# Patient Record
Sex: Female | Born: 1990 | Race: Black or African American | Hispanic: No | Marital: Single | Smoking: Never smoker
Health system: Southern US, Community
[De-identification: ages and names within clinical notes are randomized; demographics above are authoritative.]

## PROBLEM LIST (undated history)

## (undated) DIAGNOSIS — E669 Obesity, unspecified: Secondary | ICD-10-CM

## (undated) DIAGNOSIS — D649 Anemia, unspecified: Secondary | ICD-10-CM

## (undated) HISTORY — DX: Anemia, unspecified: D64.9

---

## 2008-03-15 LAB — HM PAP SMEAR: HM Pap smear: NORMAL

## 2008-04-03 ENCOUNTER — Ambulatory Visit: Payer: Self-pay | Admitting: Family Medicine

## 2008-04-25 ENCOUNTER — Inpatient Hospital Stay: Payer: Self-pay | Admitting: Obstetrics & Gynecology

## 2011-07-25 ENCOUNTER — Emergency Department: Payer: Self-pay | Admitting: Unknown Physician Specialty

## 2011-11-09 HISTORY — PX: TUBAL LIGATION: SHX77

## 2012-01-26 ENCOUNTER — Ambulatory Visit: Payer: Self-pay | Admitting: Internal Medicine

## 2012-03-15 ENCOUNTER — Encounter: Payer: Self-pay | Admitting: Internal Medicine

## 2012-03-15 ENCOUNTER — Ambulatory Visit (INDEPENDENT_AMBULATORY_CARE_PROVIDER_SITE_OTHER): Payer: BC Managed Care – PPO | Admitting: Internal Medicine

## 2012-03-15 VITALS — BP 118/72 | HR 71 | Temp 98.7°F | Wt 246.0 lb

## 2012-03-15 DIAGNOSIS — N390 Urinary tract infection, site not specified: Secondary | ICD-10-CM | POA: Insufficient documentation

## 2012-03-15 DIAGNOSIS — Z3480 Encounter for supervision of other normal pregnancy, unspecified trimester: Secondary | ICD-10-CM | POA: Insufficient documentation

## 2012-03-15 DIAGNOSIS — Z331 Pregnant state, incidental: Secondary | ICD-10-CM

## 2012-03-15 LAB — POCT URINE PREGNANCY: Preg Test, Ur: POSITIVE

## 2012-03-15 LAB — POCT URINALYSIS DIPSTICK
Glucose, UA: NEGATIVE
Nitrite, UA: NEGATIVE
Urobilinogen, UA: 2

## 2012-03-15 MED ORDER — PRENATAL VITAMINS PLUS 27-1 MG PO TABS: 1.0000 | ORAL_TABLET | Freq: Every day | ORAL | Status: DC

## 2012-03-15 MED ORDER — NITROFURANTOIN MONOHYD MACRO 100 MG PO CAPS: 100.0000 mg | ORAL_CAPSULE | Freq: Two times a day (BID) | ORAL | Status: AC

## 2012-03-15 NOTE — Progress Notes (Signed)
Subjective:    Patient ID: Cynthia Cannon, female    DOB: 24-Jul-1991, 21 y.o.   MRN: [id number]  HPI 21YO female presents to establish care. Primary concern today is 4 months of amenorrhea, nausea, fatigue. She is concerned she may be pregnant.  Also notes 1-2 weeks of dysuria, urinary urgency.  Denies fever, chills, flank pain.  Has been taking OTC Azo with no improvement.  Outpatient Encounter Prescriptions as of 03/15/2012  Medication Sig Dispense Refill  . nitrofurantoin, macrocrystal-monohydrate, (MACROBID) 100 MG capsule Take 1 capsule (100 mg total) by mouth 2 (two) times daily.  5 capsule  10  . Prenatal Vit-Fe Fumarate-FA (PRENATAL VITAMINS PLUS) 27-1 MG TABS Take 1 tablet by mouth daily.  30 tablet  2    Review of Systems  Constitutional: Positive for fatigue. Negative for fever, chills, appetite change and unexpected weight change.  HENT: Negative for ear pain, congestion, sore throat, trouble swallowing, neck pain, voice change and sinus pressure.   Eyes: Negative for visual disturbance.  Respiratory: Negative for cough, shortness of breath, wheezing and stridor.   Cardiovascular: Negative for chest pain, palpitations and leg swelling.  Gastrointestinal: Positive for nausea. Negative for vomiting, abdominal pain, diarrhea, constipation, blood in stool, abdominal distention and anal bleeding.  Genitourinary: Positive for dysuria, urgency, frequency and menstrual problem. Negative for flank pain.  Musculoskeletal: Negative for myalgias, arthralgias and gait problem.  Skin: Negative for color change and rash.  Neurological: Negative for dizziness and headaches.  Hematological: Negative for adenopathy. Does not bruise/bleed easily.  Psychiatric/Behavioral: Negative for suicidal ideas, sleep disturbance and dysphoric mood. The patient is not nervous/anxious.    BP 118/72  Pulse 71  Temp 98.7 F (37.1 C)  Wt 246 lb (111.585 kg)  SpO2 99%     Objective:   Physical Exam    Constitutional: She is oriented to person, place, and time. She appears well-developed and well-nourished. No distress.  HENT:  Head: Normocephalic and atraumatic.  Right Ear: External ear normal.  Left Ear: External ear normal.  Nose: Nose normal.  Mouth/Throat: Oropharynx is clear and moist. No oropharyngeal exudate.  Eyes: Conjunctivae are normal. Pupils are equal, round, and reactive to light. Right eye exhibits no discharge. Left eye exhibits no discharge. No scleral icterus.  Neck: Normal range of motion. Neck supple. No tracheal deviation present. No thyromegaly present.  Cardiovascular: Normal rate, regular rhythm, normal heart sounds and intact distal pulses.  Exam reveals no gallop and no friction rub.   No murmur heard. Pulmonary/Chest: Effort normal and breath sounds normal. No respiratory distress. She has no wheezes. She has no rales. She exhibits no tenderness.  Abdominal: Soft. Bowel sounds are normal. She exhibits distension (palpable uterine fundus). She exhibits no mass. There is no tenderness. There is no guarding.  Musculoskeletal: Normal range of motion. She exhibits no edema and no tenderness.  Lymphadenopathy:    She has no cervical adenopathy.  Neurological: She is alert and oriented to person, place, and time. No cranial nerve deficit. She exhibits normal muscle tone. Coordination normal.  Skin: Skin is warm and dry. No rash noted. She is not diaphoretic. No erythema. No pallor.  Psychiatric: She has a normal mood and affect. Her behavior is normal. Judgment and thought content normal.          Assessment & Plan:

## 2012-03-15 NOTE — Assessment & Plan Note (Signed)
Urine pregnancy test is positive. Pt has not had menses in 4 months.  Will set up referral to OB/GYN.  Will start prenatal vitamin.

## 2012-03-15 NOTE — Assessment & Plan Note (Signed)
Will treat with macrobid. Will send urine for culture. Pt will follow up if symptoms not improving.

## 2012-03-18 LAB — URINE CULTURE

## 2012-03-21 ENCOUNTER — Telehealth: Payer: Self-pay | Admitting: *Deleted

## 2012-03-21 MED ORDER — AMOXICILLIN-POT CLAVULANATE 500-125 MG PO TABS: 1.0000 | ORAL_TABLET | Freq: Two times a day (BID) | ORAL | Status: AC

## 2012-03-21 NOTE — Telephone Encounter (Signed)
Patient informed. New ABX Rx sent to pharmacy/[facility]

## 2012-03-21 NOTE — Telephone Encounter (Signed)
Message copied by Regis Bill on Tue Mar 21, 2012  2:32 PM ------      Message from: Ronna Polio A      Created: Sun Mar 19, 2012  9:30 PM       Urinary tract infection was resistant to macrobid. We need to change to Augmentin 500mg  po bid x7 days.

## 2012-08-01 ENCOUNTER — Observation Stay: Payer: Self-pay | Admitting: Obstetrics & Gynecology

## 2012-08-08 ENCOUNTER — Inpatient Hospital Stay: Payer: Self-pay

## 2012-08-08 LAB — CBC WITH DIFFERENTIAL/PLATELET
Basophil #: 0.1 10*3/uL (ref 0.0–0.1)
Basophil %: 0.8 %
Eosinophil #: 0.1 10*3/uL (ref 0.0–0.7)
HCT: 34.5 % — ABNORMAL LOW (ref 35.0–47.0)
HGB: 11.9 g/dL — ABNORMAL LOW (ref 12.0–16.0)
Lymphocyte %: 22.7 %
MCH: 28.2 pg (ref 26.0–34.0)
MCHC: 34.3 g/dL (ref 32.0–36.0)
Monocyte #: 0.5 x10 3/mm (ref 0.2–0.9)
Neutrophil #: 4.9 10*3/uL (ref 1.4–6.5)
RDW: 14.5 % (ref 11.5–14.5)

## 2012-08-09 LAB — HEMATOCRIT: HCT: 28.9 % — ABNORMAL LOW (ref 35.0–47.0)

## 2012-08-10 LAB — PATHOLOGY REPORT

## 2013-05-02 ENCOUNTER — Emergency Department: Payer: Self-pay | Admitting: Emergency Medicine

## 2015-02-25 NOTE — Op Note (Signed)
PATIENT NAME:  Cynthia Cannon, Cynthia Cannon MR#:  [id number] DATE OF BIRTH:  01-18-1991  DATE OF PROCEDURE:  08/08/2012  PREOPERATIVE DIAGNOSES:  89. 24 year old G2, P1-0-0-1 at 36 weeks, six weeks? gestation.  2. History of prior cesarean section.  3. Spontaneous rupture of membranes.  4. Desires permanent surgical sterilization.   POSTOPERATIVE DIAGNOSES:  78. 24 year old G2, P1-0-0-1 at 36 weeks, six weeks? gestation.  2. History of prior cesarean section.  3. Spontaneous rupture of membranes.  4. Desires permanent surgical sterilization.   PROCEDURE: 1. Repeat low transverse cesarean section.  2. Bilateral tubal ligation via Pomeroy method.   ANESTHESIA:  Spinal.   PRIMARY SURGEON: Florina Ou. Bonney Aid, MD  ASSISTANT:  Farrel Conners, CNM   ESTIMATED BLOOD LOSS: 700 mL.   OPERATIVE FLUIDS: 1500 mL of crystalloid.   DRAINS OR TUBES: Foley to gravity drainage, On-Q catheter system.   PREOPERATIVE ANTIBIOTICS: Two grams of Ancef.   IMPLANTS: None.   SPECIMENS: Portions of right and left tubes.   COMPLICATIONS: None.   INTRAOPERATIVE FINDINGS: Normal tubes, ovaries, and uterus. Minimal adhesive disease of the rectus muscles, no intra-abdominal adhesive disease. Liveborn female infant weighing 2640 grams, 5 pounds 13 ounces, Apgars 9 and 9.   PATIENT CONDITION FOLLOWING PROCEDURE: Stable.   PROCEDURE IN DETAIL: Risks, benefits, and alternatives of the procedure were discussed with the patient prior to proceeding to the operating room. The patient was taken to the operating room where spinal anesthesia was administered. She was then positioned in the supine position, prepped and draped in the usual sterile fashion. A Pfannenstiel skin incision was made utilizing the patient's pre-existing scar. This was carried down sharply to the level of the rectus fascia using a knife. The fascia was incised in the midline using a knife and then extended using Mayo scissors. The superior border of  the rectus fascia was grasped with two Kocher clamps. The underlying rectus muscle was dissected off the fascia using blunt dissection and the median raphe incised using Mayo scissors. The inferior border of the rectus fascia was then dissected off the rectus muscle in a similar fashion. The midline was identified and the peritoneum grasped with a hemostat and then incised using Metzenbaum scissors. The peritoneal incision was extended using manual traction. A bladder blade was placed and a bladder flap was developed using Metzenbaum scissors and developed digitally. The bladder blade was replaced. Hysterotomy incision was made using the knife and the uterus was entered bluntly using the operator's finger. There was still amniotic membrane covering the vertex. This was opened using an Allis clamp. Upon placing the operator's hand into the hysterotomy, the infant was noted to be in the OP position. The vertex was grasped, flexed, brought to the incision and delivered atraumatically using fundal pressure. The infant was suctioned, the cord was clamped and cut, and the infant passed to the awaiting pediatricians. Cord blood was obtained. The placenta was delivered using manual extraction. The uterus was exteriorized and wiped clean of clots and debris with two moist laps. The hysterotomy incision was then closed using a two-layer closure of 0 Vicryl with the first being a running locked and the second being an imbricating layer using vertical imbricating stitch.   Following closure of the hysterotomy incision, attention was turned to the patient's right tube. This was ligated using a Pomeroy method using a 2-0 chromic wheel with two ties on the tube. After excising the knuckle of tube both tubal ostia were visualized and noted to be hemostatic.  This was then repeated on the patient's left tube. The abdomen and pelvis were irrigated using warm saline. The uterus was replaced into the abdomen. The hysterotomy incision  and fallopian tubes were inspected and noted to be hemostatic. The peritoneum was then closed using a 2-0 Vicryl in a running fashion.   The superior border of the rectus fascia was grasped with a Kocher clamp and the On-Q catheter system was placed. The On-Q trocars were placed 4 cm above the superior border of the Pfannenstiel skin incision in the midline. After placing the trocars, they were removed and the On-Q catheters were threaded through the introducers prior to removing the introducers. Following placement of the On-Q catheters, the fascia was closed using a #1 looped PDS in a running fashion. After fascial closure, the subcutaneous dead space was reapproximated using a 2-0 plain gut. During closure of the subcutaneous tissue one of the On-Q catheters was noted to be exposed. This On-Q catheter was therefore, removed. After reapproximating the subcutaneous tissue, the incision was closed using staples. The On-Q catheter was dressed with Dermabond and bolused with 5 mL of 0.5% bupivacaine. The patient tolerated the procedure well. Sponge, needle, and instrument counts were correct times two.    ____________________________ Florina Ou. Bonney Aid, MD ams:bjt D: 08/08/2012 09:39:19 ET T: 08/08/2012 10:17:05 ET JOB#: [id number]  cc: Florina Ou. Bonney Aid, MD, <Dictator> Lorrene Reid MD ELECTRONICALLY SIGNED 08/17/2012 21:03

## 2015-03-06 ENCOUNTER — Emergency Department
Admit: 2015-03-06 | Disposition: A | Discharge status: Home / Self Care | Payer: Self-pay | Admitting: Emergency Medicine

## 2015-03-06 LAB — ED INFLUENZA
INFLAPCR: NEGATIVE
Influenza B By PCR: NEGATIVE

## 2015-03-18 NOTE — H&P (Signed)
L&D Evaluation:  History:   HPI 24 year old G2 P1001 with EDC=08/30/2012 by a 21 wk 6 day ultrasound presents at 4236 6/7 weeks with c/o SROM of clear fluid and onset contractions at 0250 this Am. She has a hx of a previous C-section for FITL and was scheduled for a repeat CS and BTL on 08/23/12. [facility] and is remarkable for late entry to care, obesity (BMI>45), +Chlamydia on 6/24 with a neg TOC on 9/27, and a negative GBS. O POS, RI, VI    Presents with contractions, leaking fluid    Patient's Medical History Obesity    Patient's Surgical History Previous C-Section    Medications Pre Natal Vitamins    Allergies NKDA    Social History none    Family History Non-Contributory   ROS:   ROS All systems were reviewed.  HEENT, CNS, GI, GU, Respiratory, CV, Renal and Musculoskeletal systems were found to be normal., see HPI   Exam:   Vital Signs stable    Urine Protein not completed    General no apparent distress    Mental Status clear    Chest clear    Heart normal sinus rhythm, no murmur/gallop/rubs    Abdomen gravid, tender with contractions    Estimated Fetal Weight Average for gestational age    Fetal Position vtx    Edema no edema    Pelvic no external lesions, 2/80%-2    Mebranes Ruptured    Description clear, grossly ruptured    FHT normal rate with no decels, 130s with accels to 150s    Fetal Heart Rate 140    Ucx q2-4    Skin dry   Impression:   Impression IUP at 36 6/7 weeks with SROM and onset labor. Previous C-section   Plan:   Plan EFM/NST, monitor contractions and for cervical change, Prepare for repeat C-section and BTL. Dr Bonney AidStaebler notified.   Electronic Signatures: Trinna BalloonGutierrez, Yarelly Kuba L (CNM)  (Signed 01-Oct-13 05:18)  Authored: L&D Evaluation   Last Updated: 01-Oct-13 05:18 by Trinna BalloonGutierrez, Nevaeh Korte L (CNM)

## 2015-03-18 NOTE — H&P (Signed)
L&D Evaluation:  History:   HPI 24yo G2P1001 at 36wks presents with c/o contractions x 2 days, worsening in frequency and intensity.  Feels they are every 5 mins.  No VB, LOF.  Good FM.  History of C/S x 1, desires repeat.  [facility]    Presents with contractions    Patient's Medical History No Chronic Illness    Patient's Surgical History Previous C-Section    Medications Pre Natal Vitamins    Allergies NKDA    Social History none    Family History Non-Contributory   Exam:   Vital Signs stable    General no apparent distress, comfortable despite contractions    Mental Status clear    Chest nl effort    Abdomen gravid, non-tender    Estimated Fetal Weight Average for gestational age    Edema no edema    Pelvic 1 / 50 / -4    FHT normal rate with no decels, 140 moderate variability, + accels, no decels    Ucx irregular every 2-365mins   Impression:   Impression R/o PTL with h/o C/S x 1   Plan:   Plan monitor contractions and for cervical change    Comments Cervix unchanged over 2 hours.  Pt remains comfortable.  Labor precautions reviewed.  RLTCS scheduled for 10/19.  [facility]    Follow Up Appointment already scheduled   Electronic Signatures: Garnette GunnerStansbury Clipp, Ali LoweEryn K (MD)  (Signed 24-Sep-13 21:58)  Authored: L&D Evaluation   Last Updated: 24-Sep-13 21:58 by Garnette GunnerStansbury Clipp, Ali LoweEryn K (MD)

## 2017-10-20 ENCOUNTER — Emergency Department: Payer: Medicaid Other

## 2017-10-20 ENCOUNTER — Encounter: Payer: Self-pay | Admitting: Emergency Medicine

## 2017-10-20 ENCOUNTER — Emergency Department
Admission: EM | Admit: 2017-10-20 | Discharge: 2017-10-20 | Disposition: A | Discharge status: Home / Self Care | Payer: Medicaid Other | Attending: Emergency Medicine | Admitting: Emergency Medicine

## 2017-10-20 DIAGNOSIS — M722 Plantar fascial fibromatosis: Secondary | ICD-10-CM | POA: Diagnosis not present

## 2017-10-20 DIAGNOSIS — Z79899 Other long term (current) drug therapy: Secondary | ICD-10-CM | POA: Diagnosis not present

## 2017-10-20 DIAGNOSIS — M79671 Pain in right foot: Secondary | ICD-10-CM | POA: Diagnosis present

## 2017-10-20 MED ORDER — TRAMADOL HCL 50 MG PO TABS: 50.0000 mg | ORAL_TABLET | Freq: Four times a day (QID) | ORAL | 0 refills | Status: DC | PRN

## 2017-10-20 MED ORDER — MELOXICAM 15 MG PO TABS: 15.0000 mg | ORAL_TABLET | Freq: Every day | ORAL | 0 refills | Status: DC

## 2017-10-20 NOTE — Discharge Instructions (Signed)
Follow up with your primary care provider or podiatry for symptoms that are not improving over the week. Return to the ER for symptoms that change or worsen if unable to schedule an appointment.

## 2017-10-20 NOTE — ED Provider Notes (Signed)
[facility] Emergency Department Provider Note ____________________________________________  Time seen: Approximately 7:42 PM  I have reviewed the triage vital signs and the nursing notes.   HISTORY  Chief Complaint Foot Pain    HPI Cynthia Cannon is a 26 y.o. female who presents to the emergency department for treatment and evaluation of right foot pain.  She states that pain started approximately 4 days ago.  She denies any known injury.  Pain increases with weightbearing.  She has taken 1 ibuprofen without any relief. Past Medical History:  Diagnosis Date  . Anemia     Patient Active Problem List   Diagnosis Date Noted  . UTI (urinary tract infection) 03/15/2012  . Prenatal care, subsequent pregnancy 03/15/2012    Past Surgical History:  Procedure Laterality Date  . CESAREAN SECTION     1    Prior to Admission medications   Medication Sig Start Date End Date Taking? Authorizing Provider  meloxicam (MOBIC) 15 MG tablet Take 1 tablet (15 mg total) by mouth daily. 10/20/17   Jameal Razzano, Kasandra Knudsenari B, FNP  Prenatal Vit-Fe Fumarate-FA (PRENATAL VITAMINS PLUS) 27-1 MG TABS Take 1 tablet by mouth daily. 03/15/12   Shelia MediaWalker, Jennifer A, MD  traMADol (ULTRAM) 50 MG tablet Take 1 tablet (50 mg total) by mouth every 6 (six) hours as needed. 10/20/17   Chinita Pesterriplett, Ponciano Shealy B, FNP    Allergies Patient has no known allergies.  Family History  Problem Relation Age of Onset  . Diabetes Mother   . Lupus Father   . Diabetes Sister     Social History Social History   Tobacco Use  . Smoking status: Never Smoker  . Smokeless tobacco: Never Used  Substance Use Topics  . Alcohol use: Yes    Comment: occasionally  . Drug use: No    Review of Systems Constitutional: Negative for recent illness or injury. Cardiovascular: Negative for chest pain Respiratory: Negative for shortness of breath Musculoskeletal: Positive for right foot pain.  Negative for right calf  pain. Skin: Negative for skin lesions, wounds, or changes. Neurological: Negative for paresthesias.  ____________________________________________   PHYSICAL EXAM:  VITAL SIGNS: ED Triage Vitals  Enc Vitals Group     BP 10/20/17 1840 (!) 162/84     Pulse Rate 10/20/17 1840 85     Resp 10/20/17 1840 16     Temp 10/20/17 1840 98.3 F (36.8 C)     Temp Source 10/20/17 1840 Oral     SpO2 10/20/17 1840 98 %     Weight 10/20/17 1841 265 lb (120.2 kg)     Height 10/20/17 1841 5\' 2"  (1.575 m)     Head Circumference --      Peak Flow --      Pain Score 10/20/17 1840 8     Pain Loc --      Pain Edu? --      Excl. in GC? --     Constitutional: Alert and oriented. Well appearing and in no acute distress. Eyes: Conjunctivae are clear without discharge or drainage Head: Atraumatic Neck: Supple. Respiratory: Respirations even and unlabored. Musculoskeletal: Tenderness over the plantar aspect of the right foot from the first metatarsal to the heel.  Full, active range of motion of the foot, toes, and ankle is demonstrated. Neurologic: Sharp and dull sensation intact over the right foot and ankle. Skin: Intact without rash, lesion, or wound. Psychiatric: Affect and behavior are appropriate.  ____________________________________________   LABS (all labs ordered are listed,  but only abnormal results are displayed)  Labs Reviewed - No data to display ____________________________________________  RADIOLOGY  Mild forefoot soft tissue swelling with small calcaneal spurs identified on image of the right foot. ____________________________________________   PROCEDURES  Procedures  ____________________________________________   INITIAL IMPRESSION / ASSESSMENT AND PLAN / ED COURSE  Cynthia Cannon is a 26 y.o. female who presents to the emergency department for evaluation and treatment of nontraumatic right foot pain.  She was prescribed meloxicam and advised to rest, ice, and  elevate the foot as often as possible over the next few days.  She was instructed to follow-up with primary care or podiatry for symptoms that are not improving with medication and rest.  She was instructed to return to the emergency department for symptoms of change or worsen if she is unable to schedule an appointment.  Medications - No data to display  Pertinent labs & imaging results that were available during my care of the patient were reviewed by me and considered in my medical decision making (see chart for details).  _________________________________________   FINAL CLINICAL IMPRESSION(S) / ED DIAGNOSES  Final diagnoses:  Plantar fasciitis of right foot    ED Discharge Orders        Ordered    meloxicam (MOBIC) 15 MG tablet  Daily     10/20/17 1945    traMADol (ULTRAM) 50 MG tablet  Every 6 hours PRN     10/20/17 1945       If controlled substance prescribed during this visit, 12 month history viewed on the [facility] prior to issuing an initial prescription for Schedule II or III opiod.    Chinita Pesterriplett, Jontavious Commons B, FNP 10/21/17 [id number]    Phineas SemenGoodman, Gray[place], MD 10/21/17 [phone]

## 2017-10-20 NOTE — ED Triage Notes (Addendum)
PT to ED via POV with c/o RT foot pain x4days, denies any known injury. Pt ambulatory but states pain with weight bearing. No deformity noted, + swelling to ankle

## 2018-07-02 ENCOUNTER — Encounter: Payer: Self-pay | Admitting: Emergency Medicine

## 2018-07-02 DIAGNOSIS — R51 Headache: Secondary | ICD-10-CM | POA: Diagnosis present

## 2018-07-02 DIAGNOSIS — Z79899 Other long term (current) drug therapy: Secondary | ICD-10-CM | POA: Insufficient documentation

## 2018-07-02 LAB — URINALYSIS, COMPLETE (UACMP) WITH MICROSCOPIC
Bacteria, UA: NONE SEEN
RBC / HPF: >50 RBC/hpf — ABNORMAL HIGH (ref 0–5)
Specific Gravity, Urine: 1.029 (ref 1.005–1.030)
WBC, UA: >50 WBC/hpf — ABNORMAL HIGH (ref 0–5)

## 2018-07-02 LAB — BASIC METABOLIC PANEL
Anion gap: 5 (ref 5–15)
BUN: 13 mg/dL (ref 6–20)
CO2: 27 mmol/L (ref 22–32)
Calcium: 8.8 mg/dL — ABNORMAL LOW (ref 8.9–10.3)
Chloride: 105 mmol/L (ref 98–111)
Creatinine, Ser: 0.86 mg/dL (ref 0.44–1.00)
GFR calc Af Amer: >60 mL/min (ref >60)
GFR calc non Af Amer: >60 mL/min (ref >60)
Glucose, Bld: 103 mg/dL — ABNORMAL HIGH (ref 70–99)
Potassium: 3.6 mmol/L (ref 3.5–5.1)
Sodium: 137 mmol/L (ref 135–145)

## 2018-07-02 LAB — CBC
HCT: 36.9 % (ref 35.0–47.0)
Hemoglobin: 12.9 g/dL (ref 12.0–16.0)
MCH: 28.5 pg (ref 26.0–34.0)
MCHC: 34.9 g/dL (ref 32.0–36.0)
MCV: 81.7 fL (ref 80.0–100.0)
Platelets: 314 10*3/uL (ref 150–440)
RBC: 4.51 MIL/uL (ref 3.80–5.20)
RDW: 15.6 % — ABNORMAL HIGH (ref 11.5–14.5)
WBC: 7.6 10*3/uL (ref 3.6–11.0)

## 2018-07-02 NOTE — ED Triage Notes (Addendum)
Patient with complaint of sudden onset of right side headache with dizziness that started about 10 minutes ago. Patient states that it is the worst headache of her life. Neuro intact.

## 2018-07-03 ENCOUNTER — Encounter: Payer: Self-pay | Admitting: Emergency Medicine

## 2018-07-03 ENCOUNTER — Emergency Department
Admission: EM | Admit: 2018-07-03 | Discharge: 2018-07-03 | Disposition: A | Discharge status: Home / Self Care | Payer: Medicaid Other | Attending: Emergency Medicine | Admitting: Emergency Medicine

## 2018-07-03 ENCOUNTER — Emergency Department: Payer: Medicaid Other

## 2018-07-03 DIAGNOSIS — R519 Headache, unspecified: Secondary | ICD-10-CM

## 2018-07-03 DIAGNOSIS — R51 Headache: Secondary | ICD-10-CM

## 2018-07-03 HISTORY — DX: Obesity, unspecified: E66.9

## 2018-07-03 MED ORDER — SODIUM CHLORIDE 0.9 % IV BOLUS
1000.0000 mL | Freq: Once | INTRAVENOUS | Status: AC
  Administered 2018-07-03: 1000 mL via INTRAVENOUS

## 2018-07-03 MED ORDER — KETOROLAC TROMETHAMINE 30 MG/ML IJ SOLN
15.0000 mg | Freq: Once | INTRAMUSCULAR | Status: AC
  Administered 2018-07-03: 15 mg via INTRAVENOUS
  Filled 2018-07-03: qty 1

## 2018-07-03 MED ORDER — SODIUM CHLORIDE 0.9 % IV BOLUS: 500.0000 mL | Freq: Once | INTRAVENOUS | Status: DC

## 2018-07-03 MED ORDER — ACETAMINOPHEN 500 MG PO TABS
1000.0000 mg | ORAL_TABLET | Freq: Once | ORAL | Status: AC
  Administered 2018-07-03: 1000 mg via ORAL
  Filled 2018-07-03: qty 2

## 2018-07-03 MED ORDER — DEXAMETHASONE SODIUM PHOSPHATE 10 MG/ML IJ SOLN
10.0000 mg | Freq: Once | INTRAMUSCULAR | Status: AC
  Administered 2018-07-03: 10 mg via INTRAVENOUS
  Filled 2018-07-03: qty 1

## 2018-07-03 MED ORDER — DIPHENHYDRAMINE HCL 50 MG/ML IJ SOLN
25.0000 mg | INTRAMUSCULAR | Status: AC
  Administered 2018-07-03: 25 mg via INTRAVENOUS
  Filled 2018-07-03: qty 1

## 2018-07-03 MED ORDER — METOCLOPRAMIDE HCL 5 MG/ML IJ SOLN
10.0000 mg | INTRAMUSCULAR | Status: AC
  Administered 2018-07-03: 10 mg via INTRAVENOUS
  Filled 2018-07-03: qty 2

## 2018-07-03 NOTE — ED Notes (Signed)
Pt to CT via wheelchair

## 2018-07-03 NOTE — ED Provider Notes (Signed)
[facility] Emergency Department Provider Note  ____________________________________________   First MD Initiated Contact with Patient 07/03/18 0129     (approximate)  I have reviewed the triage vital signs and the nursing notes.   HISTORY  Chief Complaint Headache    HPI Cynthia Cannon is a 27 y.o. female who presents for evaluation of headache.  She says that it was acute in onset and severe and is stayed constant since it started about 4-1/2 hours ago.  She says it is primarily on the left side of her head and is a sharp and throbbing pain.  No visual changes.  Some minor photophobia.  "Maybe" some nausea, but nothing significant, and no vomiting.  She has had no numbness or weakness in any of her extremities, no difficulty with ambulation, no difficulty with speech.  She does not have a history of migraines nor does anyone her family.  She sustained no traumas.  Nothing particular makes her symptoms better or worse.  She denies recent fever/chills, chest pain, shortness of breath, nausea, vomiting, abdominal pain, and dysuria.  She has had a tubal ligation and says that she could not be pregnant.  Past Medical History:  Diagnosis Date  . Anemia   . Obesity     Patient Active Problem List   Diagnosis Date Noted  . UTI (urinary tract infection) 03/15/2012  . Prenatal care, subsequent pregnancy 03/15/2012    Past Surgical History:  Procedure Laterality Date  . CESAREAN SECTION     1    Prior to Admission medications   Medication Sig Start Date End Date Taking? Authorizing Provider  meloxicam (MOBIC) 15 MG tablet Take 1 tablet (15 mg total) by mouth daily. 10/20/17   Triplett, Kasandra Knudsen, FNP  Prenatal Vit-Fe Fumarate-FA (PRENATAL VITAMINS PLUS) 27-1 MG TABS Take 1 tablet by mouth daily. 03/15/12   Shelia Media, MD  traMADol (ULTRAM) 50 MG tablet Take 1 tablet (50 mg total) by mouth every 6 (six) hours as needed. 10/20/17   Chinita Pester,  FNP    Allergies Patient has no known allergies.  Family History  Problem Relation Age of Onset  . Diabetes Mother   . Lupus Father   . Diabetes Sister     Social History Social History   Tobacco Use  . Smoking status: Never Smoker  . Smokeless tobacco: Never Used  Substance Use Topics  . Alcohol use: Yes    Comment: occasionally  . Drug use: No    Review of Systems Constitutional: No fever/chills Eyes: No visual changes.  Some mild photophobia ENT: No sore throat. Cardiovascular: Denies chest pain. Respiratory: Denies shortness of breath. Gastrointestinal: No abdominal pain.  Possibly some mild nausea, no vomiting.  No diarrhea.  No constipation. Genitourinary: Negative for dysuria. Musculoskeletal: Negative for neck pain.  Negative for back pain. Integumentary: Negative for rash. Neurological: Headache as described above.  No focal neurological deficits.   ____________________________________________   PHYSICAL EXAM:  VITAL SIGNS: ED Triage Vitals [07/02/18 2251]  Enc Vitals Group     BP (!) 127/93     Pulse Rate 84     Resp 18     Temp 97.8 F (36.6 C)     Temp Source Oral     SpO2 100 %     Weight 108 kg (238 lb)     Height 1.575 m (5\' 2" )     Head Circumference      Peak Flow  Pain Score 10     Pain Loc      Pain Edu?      Excl. in GC?     Constitutional: Alert and oriented. Well appearing and in no acute distress.  Laughing and joking with me. Eyes: Conjunctivae are normal. PERRL. EOMI. Head: Atraumatic. Nose: No congestion/rhinnorhea. Mouth/Throat: Mucous membranes are moist. Neck: No stridor.  No meningeal signs.   Cardiovascular: Normal rate, regular rhythm. Good peripheral circulation. Grossly normal heart sounds. Respiratory: Normal respiratory effort.  No retractions. Lungs CTAB. Gastrointestinal: Soft and nontender. No distention.  Musculoskeletal: No lower extremity tenderness nor edema. No gross deformities of  extremities. Neurologic:  Normal speech and language. No gross focal neurologic deficits are appreciated.  Good strength throughout major muscle groups, ambulatory without difficulty. Skin:  Skin is warm, dry and intact. No rash noted. Psychiatric: Mood and affect are normal. Speech and behavior are normal.  ____________________________________________   LABS (all labs ordered are listed, but only abnormal results are displayed)  Labs Reviewed  BASIC METABOLIC PANEL - Abnormal; Notable for the following components:      Result Value   Glucose, Bld 103 (*)    Calcium 8.8 (*)    All other components within normal limits  CBC - Abnormal; Notable for the following components:   RDW 15.6 (*)    All other components within normal limits  URINALYSIS, COMPLETE (UACMP) WITH MICROSCOPIC - Abnormal; Notable for the following components:   Color, Urine RED (*)    APPearance CLOUDY (*)    Glucose, UA   (*)    Value: TEST NOT REPORTED DUE TO COLOR INTERFERENCE OF URINE PIGMENT   Hgb urine dipstick   (*)    Value: TEST NOT REPORTED DUE TO COLOR INTERFERENCE OF URINE PIGMENT   Bilirubin Urine   (*)    Value: TEST NOT REPORTED DUE TO COLOR INTERFERENCE OF URINE PIGMENT   Ketones, ur   (*)    Value: TEST NOT REPORTED DUE TO COLOR INTERFERENCE OF URINE PIGMENT   Protein, ur   (*)    Value: TEST NOT REPORTED DUE TO COLOR INTERFERENCE OF URINE PIGMENT   Nitrite   (*)    Value: TEST NOT REPORTED DUE TO COLOR INTERFERENCE OF URINE PIGMENT   Leukocytes, UA   (*)    Value: TEST NOT REPORTED DUE TO COLOR INTERFERENCE OF URINE PIGMENT   RBC / HPF >50 (*)    WBC, UA >50 (*)    All other components within normal limits  CBG MONITORING, ED  POC URINE PREG, ED   ____________________________________________  EKG  ED ECG REPORT I, Loleta Rose, the attending physician, personally viewed and interpreted this ECG.  Date: 07/02/2018 EKG Time: 22: 58 Rate: 71 Rhythm: normal sinus rhythm QRS Axis:  normal Intervals: normal ST/T Wave abnormalities: normal Narrative Interpretation: no evidence of acute ischemia ____________________________________________  RADIOLOGY   ED MD interpretation: No evidence of acute abnormality on noncontrast head CT.    Official radiology report(s): Ct Head Wo Contrast  Result Date: 07/03/2018 CLINICAL DATA:  Right side headache, dizziness EXAM: CT HEAD WITHOUT CONTRAST TECHNIQUE: Contiguous axial images were obtained from the base of the skull through the vertex without intravenous contrast. COMPARISON:  None. FINDINGS: Brain: No acute intracranial abnormality. Specifically, no hemorrhage, hydrocephalus, mass lesion, acute infarction, or significant intracranial injury. Vascular: No hyperdense vessel or unexpected calcification. Skull: No acute calvarial abnormality. Sinuses/Orbits: Visualized paranasal sinuses and mastoids clear. Orbital soft tissues unremarkable. Other: None  IMPRESSION: Normal study. Electronically Signed   By: Charlett NoseKevin  Dover M.D.   On: 07/03/2018 00:58    ____________________________________________   PROCEDURES  Critical Care performed: No   Procedure(s) performed:   Procedures   ____________________________________________   INITIAL IMPRESSION / ASSESSMENT AND PLAN / ED COURSE  As part of my medical decision making, I reviewed the following data within the electronic MEDICAL RECORD NUMBER Nursing notes reviewed and incorporated and Labs reviewed     Differential diagnosis includes, but is not limited to, intracranial hemorrhage, meningitis/encephalitis, previous head trauma, cavernous venous thrombosis, tension headache, temporal arteritis, migraine or migraine equivalent, idiopathic intracranial hypertension, and non-specific headache.  The patient does describe a sharp, sudden, and severe headache that has not changed in severity since it began about 4 hours ago.  She has no neurological deficits.  This is more concerning for  a subarachnoid hemorrhage than for a migraine.  However, the way she is describing the pain now makes it seem a little bit more like a migraine, and in spite of her report of a severe headache, she does not appear to be in any distress and is able to laugh and joke with me.  It is very reassuring that her noncontrast head CT is normal, but I did discuss lumbar puncture with her for more definitive evaluation of the possibility of a sentinel bleed.  She said that she has had multiple epidurals in the past and does not want a lumbar puncture and I stated that I understand and agree that is not absolutely necessary at this time.  We decided that we would try empiric treatment for migraine and then reassess and if her pain is unchanged or worsens, then we will consider either lumbar puncture or additional imaging such as a CTA with contrast.  She agrees with the plan.  Clinical Course as of Jul 04 319  Mon Jul 03, 2018  [id number] The patient reports that her headache is "basically gone".  She feels much better and her nausea is resolved as well.  She says that she would like to go home.  I once again went over the "old school" recommendation for getting a lumbar puncture and offered to perform one, but she does not want it and I agree that it is not absolutely necessary.  I gave my usual customary return precautions.  Of note, lab results are within normal limits and she is currently on her menstrual cycle which accounts for the blood seen in her urine.   [CF]    Clinical Course User Index [CF] Loleta RoseForbach, Yves Fodor, MD    ____________________________________________  FINAL CLINICAL IMPRESSION(S) / ED DIAGNOSES  Final diagnoses:  Acute nonintractable headache, unspecified headache type     MEDICATIONS GIVEN DURING THIS VISIT:  Medications  dexamethasone (DECADRON) injection 10 mg (10 mg Intravenous Given 07/03/18 0201)  ketorolac (TORADOL) 30 MG/ML injection 15 mg (15 mg Intravenous Given 07/03/18 0201)   metoCLOPramide (REGLAN) injection 10 mg (10 mg Intravenous Given 07/03/18 0202)  diphenhydrAMINE (BENADRYL) injection 25 mg (25 mg Intravenous Given 07/03/18 0202)  acetaminophen (TYLENOL) tablet 1,000 mg (1,000 mg Oral Given 07/03/18 0203)  sodium chloride 0.9 % bolus 1,000 mL (0 mLs Intravenous Stopped 07/03/18 [id number])     ED Discharge Orders    None       Note:  This document was prepared using Dragon voice recognition software and may include unintentional dictation errors.    Loleta RoseForbach, Saulo Anthis, MD 07/03/18 [phone]

## 2018-07-03 NOTE — ED Notes (Signed)
Pt back from CT

## 2018-07-03 NOTE — ED Notes (Addendum)
Called CT about ordered scan; say they were waiting for ordered urine pregnancy test; spoke with pt, who is waiting patiently in waiting room with sig other, and she says she is not pregnant; CT tech informed of same; warm blanket offered to pt but she declined;

## 2018-07-03 NOTE — Discharge Instructions (Addendum)

## 2018-12-11 ENCOUNTER — Encounter: Payer: Self-pay | Admitting: Emergency Medicine

## 2018-12-11 ENCOUNTER — Emergency Department
Admission: EM | Admit: 2018-12-11 | Discharge: 2018-12-11 | Disposition: A | Discharge status: Home / Self Care | Payer: Medicaid Other | Attending: Emergency Medicine | Admitting: Emergency Medicine

## 2018-12-11 ENCOUNTER — Other Ambulatory Visit: Payer: Self-pay

## 2018-12-11 DIAGNOSIS — J020 Streptococcal pharyngitis: Secondary | ICD-10-CM | POA: Insufficient documentation

## 2018-12-11 LAB — GROUP A STREP BY PCR: Group A Strep by PCR: NOT DETECTED

## 2018-12-11 MED ORDER — PENICILLIN G BENZATHINE 1200000 UNIT/2ML IM SUSP
1.2000 10*6.[IU] | Freq: Once | INTRAMUSCULAR | Status: AC
Start: 2018-12-11 — End: 2018-12-11
  Administered 2018-12-11: 1.2 10*6.[IU] via INTRAMUSCULAR
  Filled 2018-12-11: qty 2

## 2018-12-11 NOTE — Discharge Instructions (Addendum)
Your exam is concerning even though your strep test was negative. You have been treated with a single dose of penicillin in the ED. Continue to monitor and treat any fever. Take OTC allergy medicine and Tylenol as needed. Follow-up with your provider for ongoing symptoms.

## 2018-12-11 NOTE — ED Triage Notes (Signed)
Sore throat and fever x 1 day.

## 2018-12-12 NOTE — ED Provider Notes (Signed)
[facility] Emergency Department Provider Note ____________________________________________  Time seen: 1505  I have reviewed the triage vital signs and the nursing notes.  HISTORY  Chief Complaint  Sore Throat and Fever  HPI Cynthia Cannon is a 28 y.o. female presents to the ED with a 1 day complaint of sudden fever and sore throat.  Patient gives a remote history of strep pharyngitis as a child.  She reports fullness to the throat as well as pain with swallowing.  She has also noted some high fevers yesterday.  Past Medical History:  Diagnosis Date  . Anemia   . Obesity     Patient Active Problem List   Diagnosis Date Noted  . UTI (urinary tract infection) 03/15/2012  . Prenatal care, subsequent pregnancy 03/15/2012    Past Surgical History:  Procedure Laterality Date  . CESAREAN SECTION     1    Prior to Admission medications   Medication Sig Start Date End Date Taking? Authorizing Provider  meloxicam (MOBIC) 15 MG tablet Take 1 tablet (15 mg total) by mouth daily. 10/20/17   Triplett, Kasandra Knudsen, FNP  Prenatal Vit-Fe Fumarate-FA (PRENATAL VITAMINS PLUS) 27-1 MG TABS Take 1 tablet by mouth daily. 03/15/12   Shelia Media, MD  traMADol (ULTRAM) 50 MG tablet Take 1 tablet (50 mg total) by mouth every 6 (six) hours as needed. 10/20/17   Chinita Pester, FNP    Allergies Patient has no known allergies.  Family History  Problem Relation Age of Onset  . Diabetes Mother   . Lupus Father   . Diabetes Sister     Social History Social History   Tobacco Use  . Smoking status: Never Smoker  . Smokeless tobacco: Never Used  Substance Use Topics  . Alcohol use: Yes    Comment: occasionally  . Drug use: No    Review of Systems  Constitutional: Positive for fever. Eyes: Negative for visual changes. ENT: Positive for sore throat. Cardiovascular: Negative for chest pain. Respiratory: Negative for shortness of breath. Gastrointestinal:  Negative for abdominal pain, vomiting and diarrhea. Genitourinary: Negative for dysuria. Musculoskeletal: Negative for back pain. Skin: Negative for rash. Neurological: Negative for headaches, focal weakness or numbness. ____________________________________________  PHYSICAL EXAM:  VITAL SIGNS: ED Triage Vitals  Enc Vitals Group     BP 12/11/18 1324 133/77     Pulse Rate 12/11/18 1324 91     Resp 12/11/18 1324 (!) 22     Temp 12/11/18 1324 98.4 F (36.9 C)     Temp Source 12/11/18 1324 Oral     SpO2 12/11/18 1324 100 %     Weight 12/11/18 1228 238 lb 1.6 oz (108 kg)     Height 12/11/18 1322 5\' 2"  (1.575 m)     Head Circumference --      Peak Flow --      Pain Score 12/11/18 1228 10     Pain Loc --      Pain Edu? --      Excl. in GC? --     Constitutional: Alert and oriented. Well appearing and in no distress. Head: Normocephalic and atraumatic. Eyes: Conjunctivae are normal. PERRL. Normal extraocular movements Ears: Canals clear. TMs intact bilaterally. Nose: No congestion/rhinorrhea/epistaxis. Mouth/Throat: Mucous membranes are moist.  Uvula is midline, but tonsils are enlarged, erythematous, and with exudate noted bilaterally. Neck: Supple. No thyromegaly. Hematological/Lymphatic/Immunological: Palpable anterior cervical lymphadenopathy. Cardiovascular: Normal rate, regular rhythm. Normal distal pulses. Respiratory: Normal respiratory effort. No wheezes/rales/rhonchi.t.  ____________________________________________   LABS (pertinent positives/negatives) Labs Reviewed  GROUP A STREP BY PCR  ____________________________________________  PROCEDURES  Procedures Penicillin g benzathine 1.2 million units IM ____________________________________________  INITIAL IMPRESSION / ASSESSMENT AND PLAN / ED COURSE  Patient with ED evaluation of sudden sore throat and fevers.  Patient clinical picture is highly concerning for acute strep pharyngitis.  Despite her negative strep  PCR.  We will treat the patient for strep tonsillitis.  She is opted for a single dose of penicillin G in the ED.  She will be discharged with instructions to continue to monitor and treat any fevers as necessary.  She will follow-up with primary provider or return to the ED as needed.  Orders provided for 1 day as requested. ____________________________________________  FINAL CLINICAL IMPRESSION(S) / ED DIAGNOSES  Final diagnoses:  Strep pharyngitis      Karmen Stabs, Charlesetta Ivory, PA-C 12/12/18 2349    Myrna Blazer, MD 12/14/18 [phone]

## 2019-07-16 ENCOUNTER — Encounter: Payer: Self-pay | Admitting: Emergency Medicine

## 2019-07-16 ENCOUNTER — Other Ambulatory Visit: Payer: Self-pay

## 2019-07-16 ENCOUNTER — Emergency Department
Admission: EM | Admit: 2019-07-16 | Discharge: 2019-07-16 | Disposition: A | Discharge status: Home / Self Care | Payer: Medicaid Other | Attending: Emergency Medicine | Admitting: Emergency Medicine

## 2019-07-16 DIAGNOSIS — J029 Acute pharyngitis, unspecified: Secondary | ICD-10-CM | POA: Diagnosis present

## 2019-07-16 DIAGNOSIS — J039 Acute tonsillitis, unspecified: Secondary | ICD-10-CM

## 2019-07-16 LAB — GROUP A STREP BY PCR: Group A Strep by PCR: NOT DETECTED

## 2019-07-16 MED ORDER — AMOXICILLIN 875 MG PO TABS: 875.0000 mg | ORAL_TABLET | Freq: Two times a day (BID) | ORAL | 0 refills | Status: DC

## 2019-07-16 NOTE — ED Provider Notes (Signed)
[facility] Emergency Department Provider Note  ____________________________________________   First MD Initiated Contact with Patient 07/16/19 1346     (approximate)  I have reviewed the triage vital signs and the nursing notes.   HISTORY  Chief Complaint Sore Throat   HPI Cynthia Cannon is a 28 y.o. female presents to the ED with complaint of sore throat for 1 day.  Patient states that she had similar symptoms early part of this year and it was strep throat.  She has subjective fever and chills.  She complains of increased pain with swallowing.        Past Medical History:  Diagnosis Date  . Anemia   . Obesity     Patient Active Problem List   Diagnosis Date Noted  . UTI (urinary tract infection) 03/15/2012  . Prenatal care, subsequent pregnancy 03/15/2012    Past Surgical History:  Procedure Laterality Date  . CESAREAN SECTION     1    Prior to Admission medications   Medication Sig Start Date End Date Taking? Authorizing Provider  amoxicillin (AMOXIL) 875 MG tablet Take 1 tablet (875 mg total) by mouth 2 (two) times daily. 07/16/19   Johnn Hai, PA-C  Prenatal Vit-Fe Fumarate-FA (PRENATAL VITAMINS PLUS) 27-1 MG TABS Take 1 tablet by mouth daily. 03/15/12   Jackolyn Confer, MD    Allergies Patient has no known allergies.  Family History  Problem Relation Age of Onset  . Diabetes Mother   . Lupus Father   . Diabetes Sister     Social History Social History   Tobacco Use  . Smoking status: Never Smoker  . Smokeless tobacco: Never Used  Substance Use Topics  . Alcohol use: Yes    Comment: occasionally  . Drug use: No    Review of Systems Constitutional: No fever/chills Eyes: No visual changes. ENT: Positive sore throat. Cardiovascular: Denies chest pain. Respiratory: Denies shortness of breath. Gastrointestinal: No abdominal pain.  No nausea, no vomiting. Musculoskeletal: Negative for back pain. Skin:  Negative for rash. Neurological: Negative for headaches, focal weakness or numbness. ___________________________________________   PHYSICAL EXAM:  VITAL SIGNS: ED Triage Vitals  Enc Vitals Group     BP 07/16/19 1327 130/79     Pulse Rate 07/16/19 1327 80     Resp 07/16/19 1327 18     Temp 07/16/19 1327 98.7 F (37.1 C)     Temp Source 07/16/19 1327 Oral     SpO2 07/16/19 1327 100 %     Weight 07/16/19 1327 278 lb (126.1 kg)     Height 07/16/19 1327 5\' 2"  (1.575 m)     Head Circumference --      Peak Flow --      Pain Score 07/16/19 1334 10     Pain Loc --      Pain Edu? --      Excl. in [place]? --    Constitutional: Alert and oriented. Well appearing and in no acute distress. Eyes: Conjunctivae are normal.  Head: Atraumatic. Nose: No congestion/rhinnorhea. Mouth/Throat: Mucous membranes are moist.  Oropharynx mild erythema with exudative tonsils bilaterally.  Uvula is midline. Neck: No stridor.   Hematological/Lymphatic/Immunilogical: No cervical lymphadenopathy. Cardiovascular: Normal rate, regular rhythm. Grossly normal heart sounds.  Good peripheral circulation. Respiratory: Normal respiratory effort.  No retractions. Lungs CTAB. Neurologic:  Normal speech and language. No gross focal neurologic deficits are appreciated. No gait instability. Skin:  Skin is warm, dry and intact. No rash  noted. Psychiatric: Mood and affect are normal. Speech and behavior are normal.  ____________________________________________   LABS (all labs ordered are listed, but only abnormal results are displayed)  Labs Reviewed  GROUP A STREP BY PCR    PROCEDURES  Procedure(s) performed (including Critical Care):  Procedures   ____________________________________________   INITIAL IMPRESSION / ASSESSMENT AND PLAN / ED COURSE  As part of my medical decision making, I reviewed the following data within the electronic MEDICAL RECORD NUMBER Notes from prior ED visits and [place] Controlled Substance  Database  28 year old female presents to the ED with complaint of sore throat for 2 days and fever and chills.  Patient has had strep in the past and states this feels similar.  Strep test today is negative.  On physical exam there is exudate present on the tonsils bilaterally.  Patient is not having any difficulty swallowing and is able to speak without any difficulty.  Patient was given a prescription for amoxicillin 875 twice daily for 10 days.  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  ____________________________________________   FINAL CLINICAL IMPRESSION(S) / ED DIAGNOSES  Final diagnoses:  Exudative tonsillitis     ED Discharge Orders         Ordered    amoxicillin (AMOXIL) 875 MG tablet  2 times daily     07/16/19 1459           Note:  This document was prepared using Dragon voice recognition software and may include unintentional dictation errors.    Tommi RumpsSummers, Kendalynn Wideman L, PA-C 07/16/19 1519    Sharyn CreamerQuale, Mark, MD 07/16/19 1540

## 2019-07-16 NOTE — ED Triage Notes (Signed)
C/O sore throat x 1 day.  States had similar symptoms earlier this year and it was strep throat.  Tonsils swollen with white exudate noted.  Speech clear and strong.  Controlling secretions.

## 2019-07-16 NOTE — Discharge Instructions (Signed)
Follow-up with your primary care provider or [facility] if any continued problems.  You may take Tylenol or ibuprofen as needed for pain and also fever and chills.  Increase fluids.  Take amoxicillin 875 mg twice daily for 10 days.

## 2020-03-02 IMAGING — CT CT HEAD W/O CM
3 series · 15 of 46 positions shown, 18 images · non-contrast
Comparison: None.

CLINICAL DATA: Right side headache, dizziness

EXAM:
CT HEAD WITHOUT CONTRAST
TECHNIQUE: Contiguous axial images were obtained from the base of the skull
through the vertex without intravenous contrast.

[Series 3: head wo · axial · 0.40mm/px · z∈[+445,+565]mm · 9 of 29 slices shown, 12 images]
[im 3/29  brain]
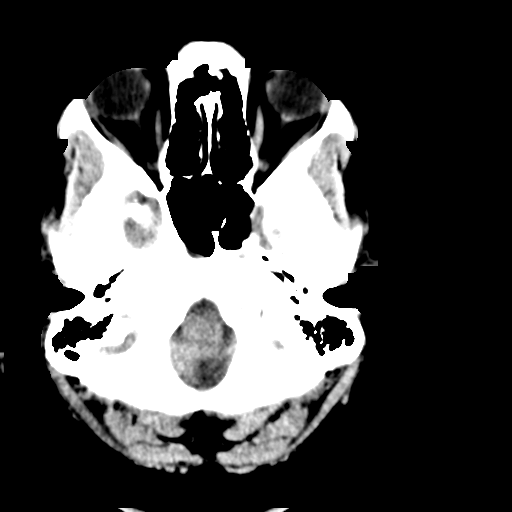
[im 3/29  bone]
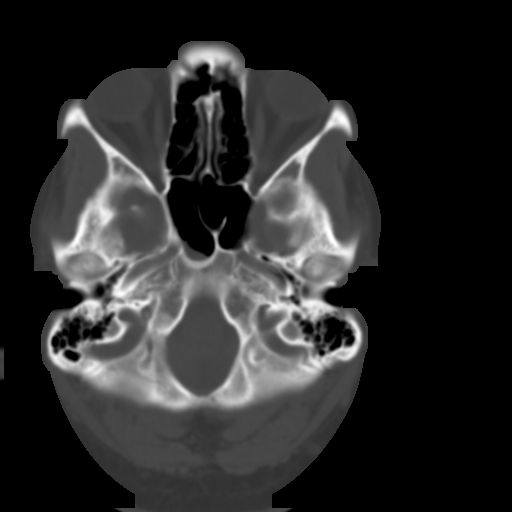
[im 6/29  brain]
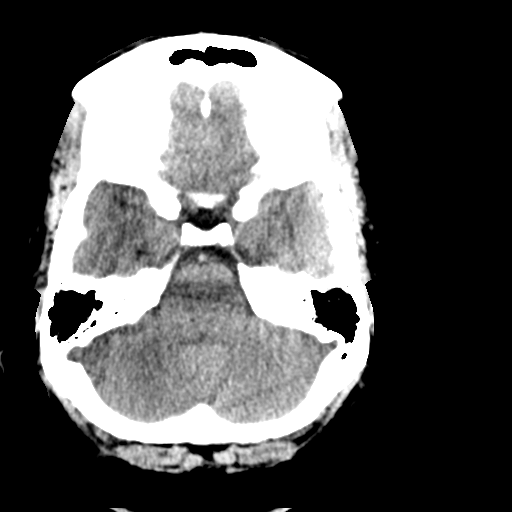
[im 9/29  brain]
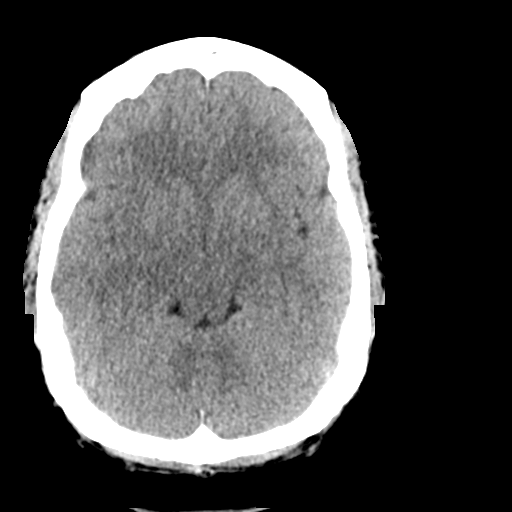
[im 12/29  brain]
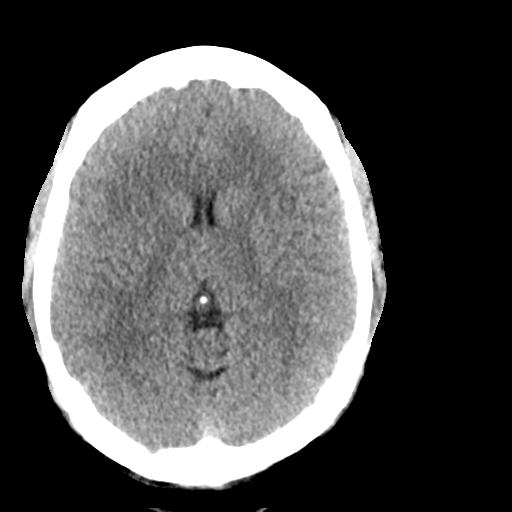
[im 15/29  brain]
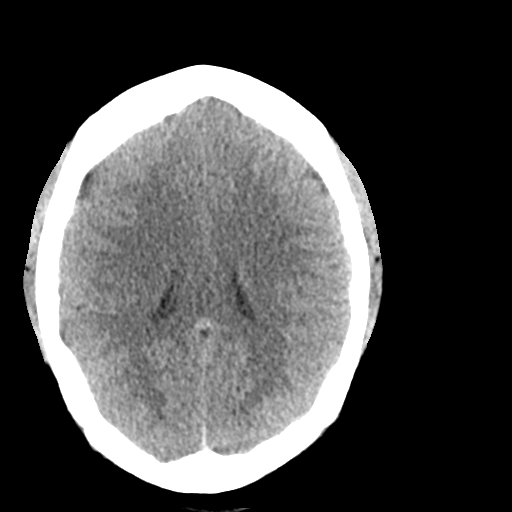
[im 15/29  bone]
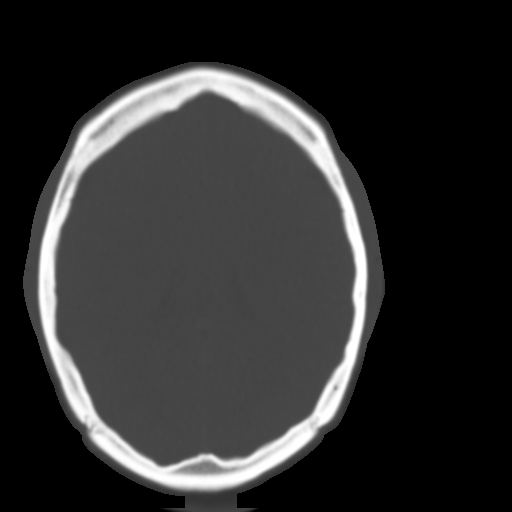
[im 18/29  brain]
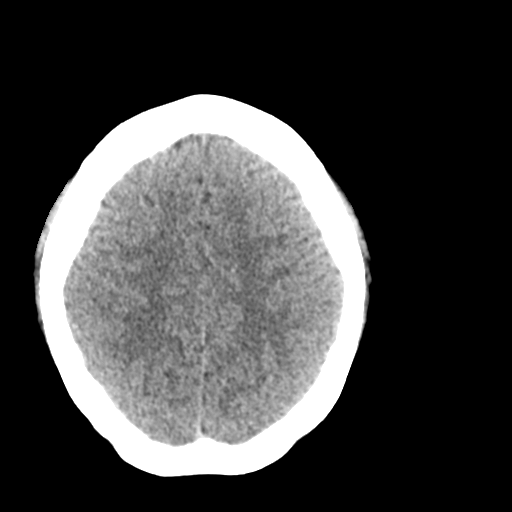
[im 21/29  brain]
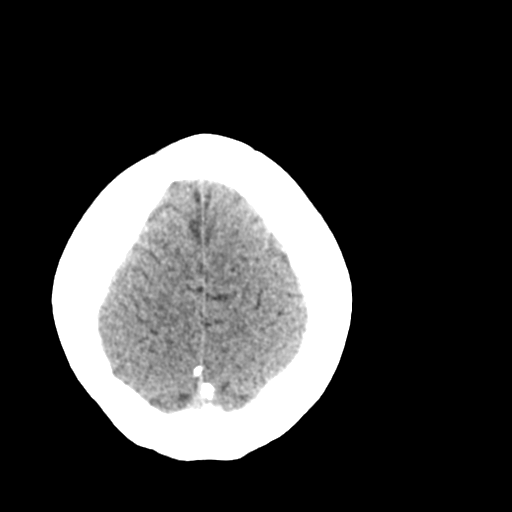
[im 24/29  brain]
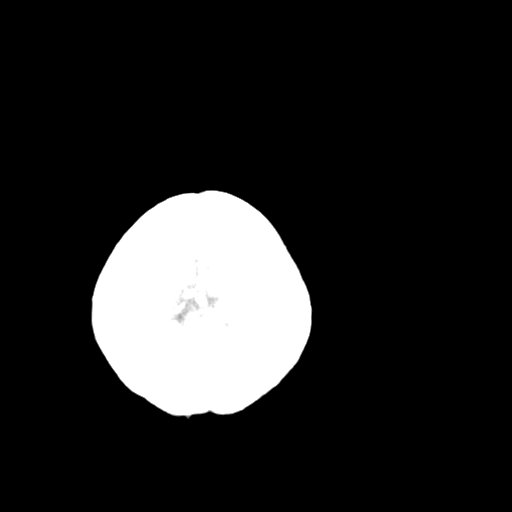
[im 27/29  brain]
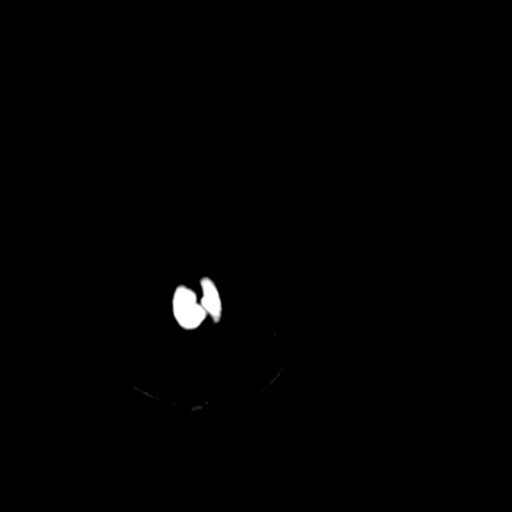
[im 27/29  bone]
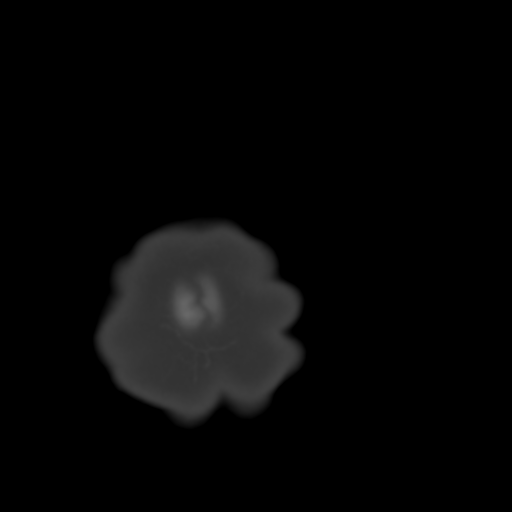

[Series 4: coronal soft tissue · coronal · 0.27mm/px · 3 of 62 slices shown]
[im 21/62  brain]
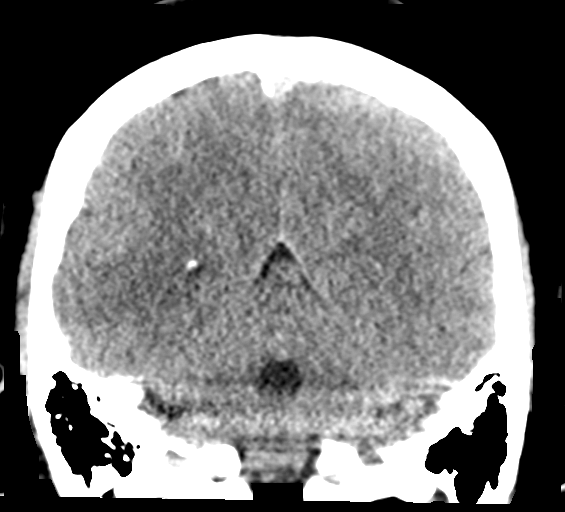
[im 28/62  brain]
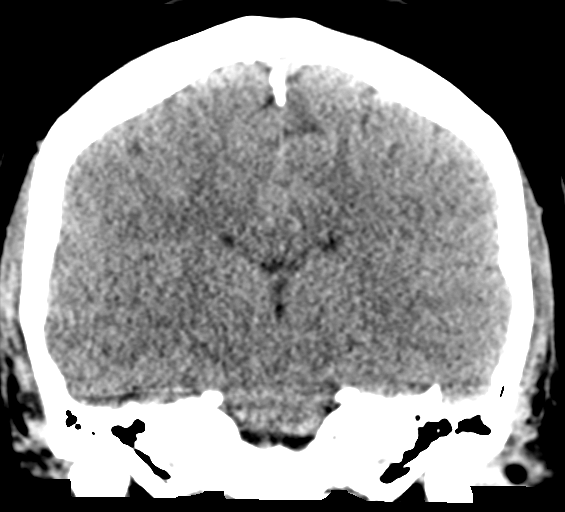
[im 34/62  brain]
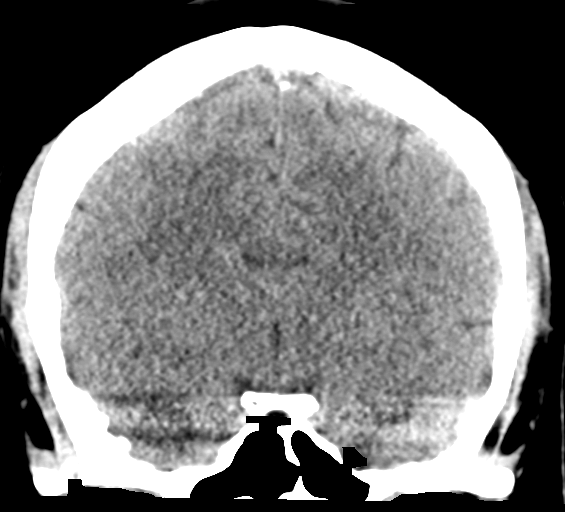

[Series 5: sagittal soft tissue · sagittal · 0.27mm/px · 3 of 51 slices shown]
[im 17/51  brain]
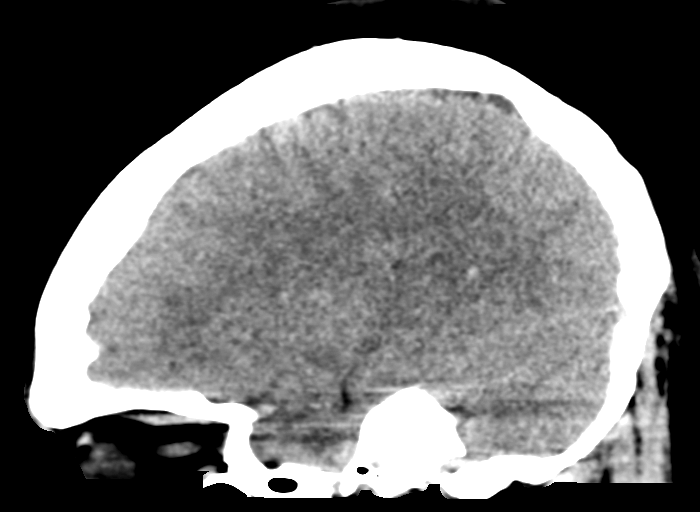
[im 26/51  brain]
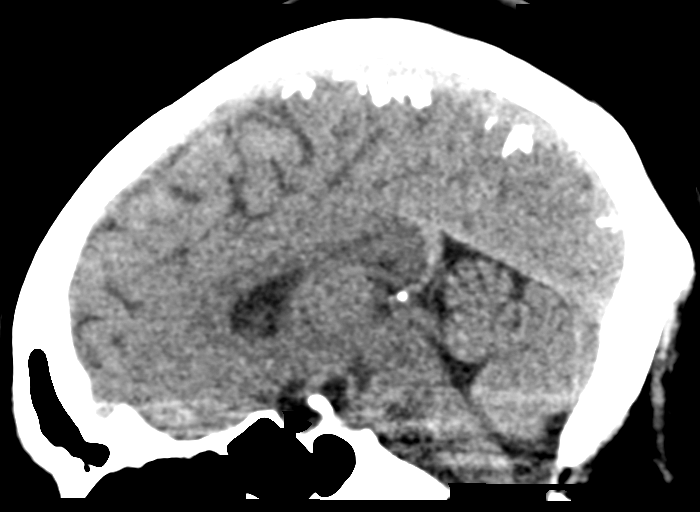
[im 34/51  brain]
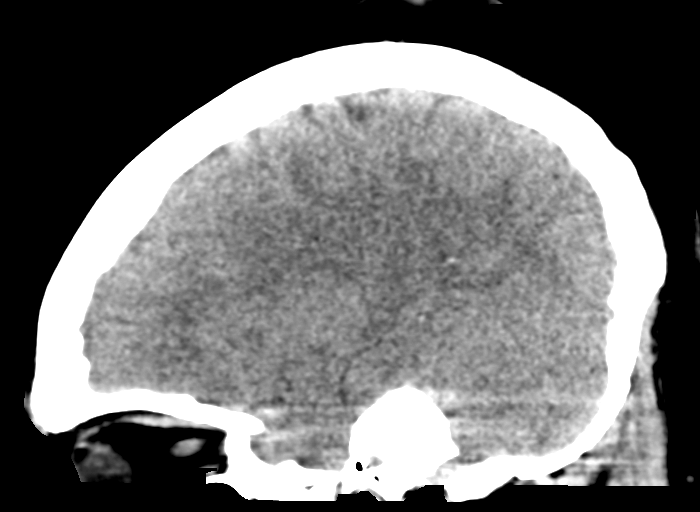

[15 of 46 positions shown; findings below may reference images not displayed]

FINDINGS: Brain: No acute intracranial abnormality. Specifically, no
hemorrhage, hydrocephalus, mass lesion, acute infarction, or
significant intracranial injury.

Vascular: No hyperdense vessel or unexpected calcification.

Skull: No acute calvarial abnormality.

Sinuses/Orbits: Visualized paranasal sinuses and mastoids clear.
Orbital soft tissues unremarkable.

Other: None
IMPRESSION: Normal study.

## 2022-01-12 ENCOUNTER — Ambulatory Visit (LOCAL_COMMUNITY_HEALTH_CENTER): Payer: Self-pay

## 2022-01-12 ENCOUNTER — Other Ambulatory Visit: Payer: Self-pay

## 2022-01-12 DIAGNOSIS — Z111 Encounter for screening for respiratory tuberculosis: Secondary | ICD-10-CM

## 2022-01-12 NOTE — Progress Notes (Signed)
Pt here for Tb skin test . Reminder given to return 01/15/22; Pt agreed.  Cherlynn Polo, RN  ?

## 2022-01-12 NOTE — Addendum Note (Signed)
Addended by: Richmond Campbell R on: 01/12/2022 03:25 PM ? ? Modules accepted: Orders ? ?

## 2022-01-15 ENCOUNTER — Ambulatory Visit (LOCAL_COMMUNITY_HEALTH_CENTER): Payer: Self-pay

## 2022-01-15 ENCOUNTER — Other Ambulatory Visit: Payer: Self-pay

## 2022-01-15 DIAGNOSIS — Z111 Encounter for screening for respiratory tuberculosis: Secondary | ICD-10-CM

## 2022-01-15 LAB — TB SKIN TEST
Induration: 0 mm
TB Skin Test: NEGATIVE

## 2023-08-09 ENCOUNTER — Emergency Department: Payer: BLUE CROSS/BLUE SHIELD | Admitting: Certified Registered Nurse Anesthetist

## 2023-08-09 ENCOUNTER — Encounter
Admission: EM | Disposition: A | Discharge status: Home / Self Care | Payer: Self-pay | Source: Home / Self Care | Attending: Emergency Medicine

## 2023-08-09 ENCOUNTER — Encounter: Payer: Self-pay | Admitting: Emergency Medicine

## 2023-08-09 ENCOUNTER — Emergency Department: Payer: BLUE CROSS/BLUE SHIELD

## 2023-08-09 ENCOUNTER — Other Ambulatory Visit: Payer: Self-pay

## 2023-08-09 ENCOUNTER — Ambulatory Visit
Admission: EM | Admit: 2023-08-09 | Discharge: 2023-08-10 | Disposition: A | Discharge status: Home / Self Care | Payer: BLUE CROSS/BLUE SHIELD | Attending: Emergency Medicine | Admitting: Emergency Medicine

## 2023-08-09 DIAGNOSIS — O00102 Left tubal pregnancy without intrauterine pregnancy: Secondary | ICD-10-CM | POA: Diagnosis not present

## 2023-08-09 DIAGNOSIS — Z98891 History of uterine scar from previous surgery: Secondary | ICD-10-CM | POA: Insufficient documentation

## 2023-08-09 DIAGNOSIS — O99211 Obesity complicating pregnancy, first trimester: Secondary | ICD-10-CM | POA: Insufficient documentation

## 2023-08-09 DIAGNOSIS — O00202 Left ovarian pregnancy without intrauterine pregnancy: Secondary | ICD-10-CM

## 2023-08-09 DIAGNOSIS — O009 Unspecified ectopic pregnancy without intrauterine pregnancy: Secondary | ICD-10-CM | POA: Diagnosis present

## 2023-08-09 DIAGNOSIS — N838 Other noninflammatory disorders of ovary, fallopian tube and broad ligament: Secondary | ICD-10-CM | POA: Diagnosis not present

## 2023-08-09 DIAGNOSIS — O99891 Other specified diseases and conditions complicating pregnancy: Secondary | ICD-10-CM | POA: Diagnosis not present

## 2023-08-09 DIAGNOSIS — O3481 Maternal care for other abnormalities of pelvic organs, first trimester: Secondary | ICD-10-CM | POA: Insufficient documentation

## 2023-08-09 DIAGNOSIS — Z3A01 Less than 8 weeks gestation of pregnancy: Secondary | ICD-10-CM

## 2023-08-09 DIAGNOSIS — O99212 Obesity complicating pregnancy, second trimester: Secondary | ICD-10-CM | POA: Diagnosis not present

## 2023-08-09 HISTORY — PX: LAPAROSCOPIC BILATERAL SALPINGECTOMY: SHX5889

## 2023-08-09 LAB — CBC
HCT: 34.7 % — ABNORMAL LOW (ref 36.0–46.0)
HCT: 32.3 % — ABNORMAL LOW (ref 36.0–46.0)
Hemoglobin: 12.2 g/dL (ref 12.0–15.0)
Hemoglobin: 11.3 g/dL — ABNORMAL LOW (ref 12.0–15.0)
MCH: 30.5 pg (ref 26.0–34.0)
MCH: 30.3 pg (ref 26.0–34.0)
MCHC: 35.2 g/dL (ref 30.0–36.0)
MCHC: 35 g/dL (ref 30.0–36.0)
MCV: 86.8 fL (ref 80.0–100.0)
MCV: 86.6 fL (ref 80.0–100.0)
Platelets: 274 10*3/uL (ref 150–400)
Platelets: 256 10*3/uL (ref 150–400)
RBC: 4 MIL/uL (ref 3.87–5.11)
RBC: 3.73 MIL/uL — ABNORMAL LOW (ref 3.87–5.11)
RDW: 12.7 % (ref 11.5–15.5)
RDW: 12.8 % (ref 11.5–15.5)
WBC: 5.6 10*3/uL (ref 4.0–10.5)
WBC: 5.3 10*3/uL (ref 4.0–10.5)
nRBC: 0 % (ref 0.0–0.2)
nRBC: 0 % (ref 0.0–0.2)

## 2023-08-09 LAB — TYPE AND SCREEN
ABO/RH(D): O POS
Antibody Screen: NEGATIVE

## 2023-08-09 LAB — BASIC METABOLIC PANEL
Anion gap: 8 (ref 5–15)
BUN: 6 mg/dL (ref 6–20)
CO2: 23 mmol/L (ref 22–32)
Calcium: 8.6 mg/dL — ABNORMAL LOW (ref 8.9–10.3)
Chloride: 104 mmol/L (ref 98–111)
Creatinine, Ser: 0.74 mg/dL (ref 0.44–1.00)
GFR, Estimated: >60 mL/min (ref >60)
Glucose, Bld: 102 mg/dL — ABNORMAL HIGH (ref 70–99)
Potassium: 3.3 mmol/L — ABNORMAL LOW (ref 3.5–5.1)
Sodium: 135 mmol/L (ref 135–145)

## 2023-08-09 LAB — ABO/RH: ABO/RH(D): O POS

## 2023-08-09 LAB — HCG, QUANTITATIVE, PREGNANCY: hCG, Beta Chain, Quant, S: 5212 m[IU]/mL — ABNORMAL HIGH (ref <5)

## 2023-08-09 LAB — POC URINE PREG, ED: Preg Test, Ur: POSITIVE — AB

## 2023-08-09 SURGERY — SALPINGECTOMY, BILATERAL, LAPAROSCOPIC
Anesthesia: General | Site: Abdomen | Laterality: Bilateral

## 2023-08-09 MED ORDER — PHENYLEPHRINE HCL (PRESSORS) 10 MG/ML IV SOLN
INTRAVENOUS | Status: DC | PRN
  Administered 2023-08-09: 80 ug via INTRAVENOUS
  Administered 2023-08-09: 160 ug via INTRAVENOUS
  Administered 2023-08-09: 80 ug via INTRAVENOUS
  Administered 2023-08-09 (×2): 160 ug via INTRAVENOUS

## 2023-08-09 MED ORDER — GABAPENTIN 300 MG PO CAPS
300.0000 mg | ORAL_CAPSULE | ORAL | Status: AC
  Administered 2023-08-09: 300 mg via ORAL

## 2023-08-09 MED ORDER — OXYCODONE HCL 5 MG PO TABS
ORAL_TABLET | ORAL | Status: AC
  Filled 2023-08-09: qty 1

## 2023-08-09 MED ORDER — LACTATED RINGERS IV SOLN: INTRAVENOUS | Status: DC

## 2023-08-09 MED ORDER — GLYCOPYRROLATE 0.2 MG/ML IJ SOLN
INTRAMUSCULAR | Status: AC
  Filled 2023-08-09: qty 1

## 2023-08-09 MED ORDER — SUGAMMADEX SODIUM 200 MG/2ML IV SOLN
INTRAVENOUS | Status: DC | PRN
  Administered 2023-08-09: 200 mg via INTRAVENOUS

## 2023-08-09 MED ORDER — IBUPROFEN 600 MG PO TABS: 600.0000 mg | ORAL_TABLET | Freq: Four times a day (QID) | ORAL | 0 refills | Status: AC | PRN

## 2023-08-09 MED ORDER — DROPERIDOL 2.5 MG/ML IJ SOLN
0.6250 mg | Freq: Once | INTRAMUSCULAR | Status: AC | PRN
  Administered 2023-08-09: 0.625 mg via INTRAVENOUS

## 2023-08-09 MED ORDER — PHENYLEPHRINE 80 MCG/ML (10ML) SYRINGE FOR IV PUSH (FOR BLOOD PRESSURE SUPPORT)
PREFILLED_SYRINGE | INTRAVENOUS | Status: AC
  Filled 2023-08-09: qty 10

## 2023-08-09 MED ORDER — LIDOCAINE HCL (CARDIAC) PF 100 MG/5ML IV SOSY
PREFILLED_SYRINGE | INTRAVENOUS | Status: DC | PRN
  Administered 2023-08-09: 80 mg via INTRAVENOUS

## 2023-08-09 MED ORDER — FENTANYL CITRATE (PF) 100 MCG/2ML IJ SOLN
INTRAMUSCULAR | Status: AC
  Filled 2023-08-09: qty 2

## 2023-08-09 MED ORDER — GLYCOPYRROLATE 0.2 MG/ML IJ SOLN
INTRAMUSCULAR | Status: DC | PRN
  Administered 2023-08-09: .2 mg via INTRAVENOUS

## 2023-08-09 MED ORDER — GABAPENTIN 300 MG PO CAPS
ORAL_CAPSULE | ORAL | Status: AC
  Filled 2023-08-09: qty 1

## 2023-08-09 MED ORDER — DOCUSATE SODIUM 100 MG PO CAPS: 100.0000 mg | ORAL_CAPSULE | Freq: Two times a day (BID) | ORAL | 2 refills | Status: AC | PRN

## 2023-08-09 MED ORDER — METHYLENE BLUE (ANTIDOTE) 1 % IV SOLN
INTRAVENOUS | Status: AC
  Filled 2023-08-09: qty 10

## 2023-08-09 MED ORDER — ROCURONIUM BROMIDE 10 MG/ML (PF) SYRINGE
PREFILLED_SYRINGE | INTRAVENOUS | Status: AC
  Filled 2023-08-09: qty 10

## 2023-08-09 MED ORDER — ONDANSETRON HCL 4 MG/2ML IJ SOLN
INTRAMUSCULAR | Status: AC
  Filled 2023-08-09: qty 2

## 2023-08-09 MED ORDER — CELECOXIB 200 MG PO CAPS
400.0000 mg | ORAL_CAPSULE | ORAL | Status: AC
  Administered 2023-08-09: 400 mg via ORAL

## 2023-08-09 MED ORDER — FENTANYL CITRATE (PF) 100 MCG/2ML IJ SOLN
25.0000 ug | INTRAMUSCULAR | Status: DC | PRN
  Administered 2023-08-09 (×2): 25 ug via INTRAVENOUS

## 2023-08-09 MED ORDER — PROPOFOL 10 MG/ML IV BOLUS
INTRAVENOUS | Status: DC | PRN
Start: 2023-08-09 — End: 2023-08-09
  Administered 2023-08-09: 160 mg via INTRAVENOUS

## 2023-08-09 MED ORDER — OXYCODONE-ACETAMINOPHEN 5-325 MG PO TABS
1.0000 | ORAL_TABLET | Freq: Four times a day (QID) | ORAL | 0 refills | Status: AC | PRN
Start: 2023-08-09 — End: 2024-08-08

## 2023-08-09 MED ORDER — DEXAMETHASONE SODIUM PHOSPHATE 10 MG/ML IJ SOLN
INTRAMUSCULAR | Status: DC | PRN
  Administered 2023-08-09: 10 mg via INTRAVENOUS

## 2023-08-09 MED ORDER — MIDAZOLAM HCL 2 MG/2ML IJ SOLN
INTRAMUSCULAR | Status: AC
  Filled 2023-08-09: qty 2

## 2023-08-09 MED ORDER — ACETAMINOPHEN 500 MG PO TABS
1000.0000 mg | ORAL_TABLET | ORAL | Status: AC
  Administered 2023-08-09: 1000 mg via ORAL

## 2023-08-09 MED ORDER — CELECOXIB 200 MG PO CAPS
ORAL_CAPSULE | ORAL | Status: AC
  Filled 2023-08-09: qty 2

## 2023-08-09 MED ORDER — METHYLENE BLUE (ANTIDOTE) 1 % IV SOLN
INTRAVENOUS | Status: DC | PRN
Start: 2023-08-09 — End: 2023-08-09
  Administered 2023-08-09: 50 mg via INTRAVENOUS

## 2023-08-09 MED ORDER — ACETAMINOPHEN 500 MG PO TABS
ORAL_TABLET | ORAL | Status: AC
  Filled 2023-08-09: qty 2

## 2023-08-09 MED ORDER — ONDANSETRON HCL 4 MG/2ML IJ SOLN
INTRAMUSCULAR | Status: DC | PRN
  Administered 2023-08-09: 4 mg via INTRAVENOUS

## 2023-08-09 MED ORDER — LIDOCAINE HCL (PF) 2 % IJ SOLN
INTRAMUSCULAR | Status: AC
  Filled 2023-08-09: qty 5

## 2023-08-09 MED ORDER — MIDAZOLAM HCL 2 MG/2ML IJ SOLN
INTRAMUSCULAR | Status: DC | PRN
  Administered 2023-08-09: 2 mg via INTRAVENOUS

## 2023-08-09 MED ORDER — CHLORHEXIDINE GLUCONATE 0.12 % MT SOLN
OROMUCOSAL | Status: AC
  Filled 2023-08-09: qty 15

## 2023-08-09 MED ORDER — SUCCINYLCHOLINE CHLORIDE 200 MG/10ML IV SOSY
PREFILLED_SYRINGE | INTRAVENOUS | Status: DC | PRN
  Administered 2023-08-09: 140 mg via INTRAVENOUS

## 2023-08-09 MED ORDER — ACETAMINOPHEN 10 MG/ML IV SOLN
INTRAVENOUS | Status: AC
  Filled 2023-08-09: qty 100

## 2023-08-09 MED ORDER — CHLORHEXIDINE GLUCONATE 0.12 % MT SOLN
15.0000 mL | Freq: Once | OROMUCOSAL | Status: AC
  Administered 2023-08-09: 15 mL via OROMUCOSAL

## 2023-08-09 MED ORDER — LACTATED RINGERS IR SOLN
Status: DC | PRN
  Administered 2023-08-09: 500 mL

## 2023-08-09 MED ORDER — PROPOFOL 10 MG/ML IV BOLUS
INTRAVENOUS | Status: AC
  Filled 2023-08-09: qty 20

## 2023-08-09 MED ORDER — SUCCINYLCHOLINE CHLORIDE 200 MG/10ML IV SOSY
PREFILLED_SYRINGE | INTRAVENOUS | Status: AC
  Filled 2023-08-09: qty 10

## 2023-08-09 MED ORDER — ROCURONIUM BROMIDE 100 MG/10ML IV SOLN
INTRAVENOUS | Status: DC | PRN
  Administered 2023-08-09: 20 mg via INTRAVENOUS
  Administered 2023-08-09: 50 mg via INTRAVENOUS
  Administered 2023-08-09: 20 mg via INTRAVENOUS
  Administered 2023-08-09: 10 mg via INTRAVENOUS

## 2023-08-09 MED ORDER — BUPIVACAINE HCL 0.5 % IJ SOLN
INTRAMUSCULAR | Status: DC | PRN
  Administered 2023-08-09: 20 mL

## 2023-08-09 MED ORDER — METHOTREXATE FOR ECTOPIC PREGNANCY
50.0000 mg/m2 | Freq: Once | INTRAMUSCULAR | Status: AC
  Administered 2023-08-09: 117.5 mg via INTRAMUSCULAR
  Filled 2023-08-09: qty 4.7

## 2023-08-09 MED ORDER — DROPERIDOL 2.5 MG/ML IJ SOLN
INTRAMUSCULAR | Status: AC
  Filled 2023-08-09: qty 2

## 2023-08-09 MED ORDER — OXYCODONE HCL 5 MG PO TABS
5.0000 mg | ORAL_TABLET | Freq: Once | ORAL | Status: AC | PRN
  Administered 2023-08-09: 5 mg via ORAL

## 2023-08-09 MED ORDER — ACETAMINOPHEN 10 MG/ML IV SOLN: 1000.0000 mg | Freq: Once | INTRAVENOUS | Status: DC | PRN

## 2023-08-09 MED ORDER — FENTANYL CITRATE (PF) 100 MCG/2ML IJ SOLN
INTRAMUSCULAR | Status: DC | PRN
  Administered 2023-08-09 (×6): 50 ug via INTRAVENOUS

## 2023-08-09 MED ORDER — BUPIVACAINE HCL (PF) 0.5 % IJ SOLN
INTRAMUSCULAR | Status: AC
  Filled 2023-08-09: qty 30

## 2023-08-09 MED ORDER — OXYCODONE HCL 5 MG/5ML PO SOLN: 5.0000 mg | Freq: Once | ORAL | Status: AC | PRN

## 2023-08-09 MED ORDER — DEXAMETHASONE SODIUM PHOSPHATE 10 MG/ML IJ SOLN
INTRAMUSCULAR | Status: AC
  Filled 2023-08-09: qty 1

## 2023-08-09 SURGICAL SUPPLY — 51 items
ADH SKN CLS APL DERMABOND .7 (GAUZE/BANDAGES/DRESSINGS) ×1
APL PRP STRL LF DISP 70% ISPRP (MISCELLANEOUS) ×1
APL SRG 38 LTWT LNG FL B (MISCELLANEOUS) ×1
APL SWBSTK 6 STRL LF DISP (MISCELLANEOUS) ×1
APPLICATOR ARISTA FLEXITIP XL (MISCELLANEOUS) IMPLANT
APPLICATOR COTTON TIP 6 STRL (MISCELLANEOUS) IMPLANT
APPLICATOR COTTON TIP 6IN STRL (MISCELLANEOUS) ×1 IMPLANT
BLADE SURG SZ11 CARB STEEL (BLADE) ×1 IMPLANT
CATH ROBINSON RED A/P 16FR (CATHETERS) ×1 IMPLANT
CHLORAPREP W/TINT 26 (MISCELLANEOUS) ×1 IMPLANT
CORD MONOPOLAR M/FML 12FT (MISCELLANEOUS) IMPLANT
DERMABOND ADVANCED .7 DNX12 (GAUZE/BANDAGES/DRESSINGS) ×1 IMPLANT
DRESSING SURGICEL FIBRLLR 1X2 (HEMOSTASIS) IMPLANT
DRSG SURGICEL FIBRILLAR 1X2 (HEMOSTASIS) ×2
GAUZE 4X4 16PLY ~~LOC~~+RFID DBL (SPONGE) ×2 IMPLANT
GLOVE BIO SURGEON STRL SZ 6.5 (GLOVE) ×2 IMPLANT
GLOVE INDICATOR 7.0 STRL GRN (GLOVE) ×1 IMPLANT
GLOVE PI ORTHO PRO STRL 7.5 (GLOVE) ×1 IMPLANT
GOWN STRL REUS W/ TWL LRG LVL3 (GOWN DISPOSABLE) ×3 IMPLANT
GOWN STRL REUS W/TWL LRG LVL3 (GOWN DISPOSABLE) ×3
GOWN STRL REUS W/TWL XL LVL4 (GOWN DISPOSABLE) ×1 IMPLANT
GRASPER SUT TROCAR 14GX15 (MISCELLANEOUS) IMPLANT
HEMOSTAT ARISTA ABSORB 3G PWDR (HEMOSTASIS) IMPLANT
IRRIGATION STRYKERFLOW (MISCELLANEOUS) ×1 IMPLANT
IRRIGATOR STRYKERFLOW (MISCELLANEOUS) ×2
IV LACTATED RINGERS 1000ML (IV SOLUTION) ×1 IMPLANT
KIT PINK PAD W/HEAD ARE REST (MISCELLANEOUS) ×1 IMPLANT
KIT PINK PAD W/HEAD ARM REST (MISCELLANEOUS) ×1 IMPLANT
KIT TURNOVER CYSTO (KITS) ×1 IMPLANT
MANIFOLD NEPTUNE II (INSTRUMENTS) ×1 IMPLANT
NS IRRIG 500ML POUR BTL (IV SOLUTION) ×1 IMPLANT
PACK GYN LAPAROSCOPIC (MISCELLANEOUS) ×1 IMPLANT
PAD OB MATERNITY 4.3X12.25 (PERSONAL CARE ITEMS) ×1 IMPLANT
PAD PREP OB/GYN DISP 24X41 (PERSONAL CARE ITEMS) ×1 IMPLANT
SCISSORS METZENBAUM CVD 33 (INSTRUMENTS) IMPLANT
SCRUB CHG 4% DYNA-HEX 4OZ (MISCELLANEOUS) ×1 IMPLANT
SET CYSTO W/LG BORE CLAMP LF (SET/KITS/TRAYS/PACK) IMPLANT
SET TUBE SMOKE EVAC HIGH FLOW (TUBING) ×1 IMPLANT
SHEARS HARMONIC ACE PLUS 36CM (ENDOMECHANICALS) IMPLANT
SLEEVE Z-THREAD 5X100MM (TROCAR) ×1 IMPLANT
SUT VIC AB 3-0 SH 27 (SUTURE)
SUT VIC AB 3-0 SH 27X BRD (SUTURE) IMPLANT
SUT VIC AB 4-0 FS2 27 (SUTURE) ×1 IMPLANT
SUT VICRYL 0 UR6 27IN ABS (SUTURE) ×1 IMPLANT
SYS BAG RETRIEVAL 10MM (BASKET) ×1
SYSTEM BAG RETRIEVAL 10MM (BASKET) IMPLANT
TRAP FLUID SMOKE EVACUATOR (MISCELLANEOUS) ×1 IMPLANT
TROCAR XCEL UNIV SLVE 11M 100M (ENDOMECHANICALS) IMPLANT
TROCAR Z-THRD FIOS HNDL 11X100 (TROCAR) ×1 IMPLANT
TROCAR Z-THREAD FIOS 5X100MM (TROCAR) ×1 IMPLANT
WATER STERILE IRR 500ML POUR (IV SOLUTION) ×1 IMPLANT

## 2023-08-09 NOTE — ED Notes (Signed)
Pt procedure scheduled for 2100 unless pt begins bleeding or having severe pain.

## 2023-08-09 NOTE — Transfer of Care (Signed)
Immediate Anesthesia Transfer of Care Note  Patient: Cynthia Cannon  Procedure(s) Performed: LAPAROSCOPIC BILATERAL SALPINGECTOMY WITH REMOVAL OF LEFT ECTOPIC, LYSIS OF ADHESIONS, CYSTOSCOPY (Bilateral: Abdomen)  Patient Location: PACU  Anesthesia Type:General  Level of Consciousness: drowsy  Airway & Oxygen Therapy: Patient Spontanous Breathing and Patient connected to face mask oxygen  Post-op Assessment: Report given to RN and Post -op Vital signs reviewed and stable  Post vital signs: Reviewed  Last Vitals:  Vitals Value Taken Time  BP 90/59 08/09/23 2300  Temp    Pulse 102 08/09/23 2302  Resp 14 08/09/23 2302  SpO2 100 % 08/09/23 2302  Vitals shown include unfiled device data.  Last Pain:  Vitals:   08/09/23 1756  TempSrc: Temporal  PainSc: 0-No pain         Complications: No notable events documented.

## 2023-08-09 NOTE — Consult Note (Cosign Needed Addendum)
Consult Note  Requesting Attending Physician :  Jene Every, MD Service Requesting Consult : ED  Assessment/Recommendations:  Cynthia Cannon is a 32 y.o. G2P1102 at [redacted]w[redacted]d seen in consultation at the request of Jene Every, MD regarding ectopic pregnancy. Discussed plan with Dr. Valentino Saxon. Recommend surgical management of ectopic and bilateral salpingectomy. NPO in preparation for surgery.  Active Problems:   * No active hospital problems. *   HPI Reason for Consult:  Ectopic pregnancy  [name] presented to the ED after a positive pregnancy test and heavy vaginal vaginal bleeding. She had a BTL in 2013. She normally has regular monthly periods. She took a pregnancy test in September when she missed her period. She began bleeding yesterday. The bleeding is lighter today. She reports very mild abdominal pain. She would like her tubes removed if she has surgical management of her ectopic pregnancy. She did eat peanut butter crackers at 1300 but has not had anything to eat since then.  Allergies: NKA  Medications:  None  Obstetric History:  [id number] 2 Cesarean births BTL 2013   Social History: Social History   Socioeconomic History   Marital status: Single    Spouse name: Not on file   Number of children: Not on file   Years of education: Not on file   Highest education level: Not on file  Occupational History   Not on file  Tobacco Use   Smoking status: Never   Smokeless tobacco: Never  Substance and Sexual Activity   Alcohol use: Yes    Comment: occasionally   Drug use: No   Sexual activity: Not on file  Other Topics Concern   Not on file  Social History Narrative   Lives in [place] with son, Kristeen Mans, [place].   Work - [employer]   Diet- regular, limited soda   Exercise- occasional   Social Determinants of Corporate investment banker Strain: Not on file  Food Insecurity: Not on file  Transportation Needs: Not on file  Physical Activity: Not on file   Stress: Not on file  Social Connections: Not on file    Family History: family history includes Diabetes in her mother and sister; Lupus in her father.  Review of Systems: Review of Systems - History obtained from the patient General ROS: negative Respiratory ROS: no cough, shortness of breath, or wheezing Cardiovascular ROS: no chest pain or dyspnea on exertion Gastrointestinal ROS: no abdominal pain, change in bowel habits, or black or bloody stools Genito-Urinary ROS: positive for mild abdominal pain    Objective :  Vital signs in last 24 hours: Temp:  [98.8 F (37.1 C)] 98.8 F (37.1 C) (10/01 1039) Pulse Rate:  [118] 118 (10/01 1039) Resp:  [20] 20 (10/01 1039) BP: (162)/(87) 162/87 (10/01 1039) SpO2:  [99 %] 99 % (10/01 1039) Weight:  [126.1 kg] 126.1 kg (10/01 1049)  Intake/Output last 3 shifts: No intake/output data recorded.   Physical Exam: General: alert, cooperative, NAD Cardiac: RRR Lungs: CTAB Abdomen: soft, normal bowel sounds, no guarding, no rebound tenderness, mild tenderness to palpation in suprapubic area Vaginal bleeding: small amount of bright red bleeding  Test Results bhCG: 5,212 mIU/mL Imaging:  Pelvic US - IMPRESSION: No IUP visualized. 5.8 cm heterogeneous left adnexal mass, highly suspicious for ectopic pregnancy.   I spent 30 minutes involved in the care of this patient preparing to see the patient by obtaining and reviewing her medical history (including labs, imaging tests and prior procedures), documenting clinical information in the  electronic health record (EHR), counseling and coordinating care plans, writing and sending prescriptions, ordering tests or procedures and in direct communicating with the patient and medical staff discussing pertinent items from her history and physical exam.   Glenetta Borg, CNM  08/09/2023 3:22 PM

## 2023-08-09 NOTE — Brief Op Note (Signed)
08/09/2023  10:49 PM  PATIENT:  Cynthia Cannon is a  33 y.o. G32P2002 female who presented to the Emergency Room with missed menses, vaginal bleeding.     PRE-OPERATIVE DIAGNOSIS:  Left ectopic pregnancy, History of bilateral tubal ligation  POST-OPERATIVE DIAGNOSIS:  Left ectopic pregnancy, History of bilateral tubal ligation, denise pelvic adhesions  PROCEDURE:  Procedure(s): LAPAROSCOPIC BILATERAL SALPINGECTOMY WITH REMOVAL OF LEFT ECTOPIC, LYSIS OF ADHESIONS, CYSTOSCOPY (Bilateral)  SURGEON:  Surgeons and Role:    Hildred Laser, MD - Primary  ASSISTANTS: Surgical scrub tech   ANESTHESIA:   general  EBL:  200 mL   BLOOD ADMINISTERED:none  DRAINS: none   LOCAL MEDICATIONS USED: 0.5% MARCAINE  (20 ml)  SPECIMEN:  Right fallopian tube, segment of left fallopian tube and/or possible ovary  DISPOSITION OF SPECIMEN:  PATHOLOGY  COUNTS:  YES  TOURNIQUET:  * No tourniquets in log *  DICTATION: .Note written in EPIC  PLAN OF CARE: Discharge to home after PACU  PATIENT DISPOSITION:  PACU - hemodynamically stable.     Hildred Laser, MD [facility]

## 2023-08-09 NOTE — Anesthesia Procedure Notes (Signed)
Procedure Name: Intubation Date/Time: 08/09/2023 6:47 PM  Performed by: Mathews Argyle, CRNAPre-anesthesia Checklist: Patient identified, Patient being monitored, Timeout performed, Emergency Drugs available and Suction available Patient Re-evaluated:Patient Re-evaluated prior to induction Oxygen Delivery Method: Circle system utilized Preoxygenation: Pre-oxygenation with 100% oxygen Induction Type: IV induction and Rapid sequence Laryngoscope Size: 3 and McGraph Grade View: Grade I Tube type: Oral Tube size: 7.0 mm Number of attempts: 1 Airway Equipment and Method: Stylet and Video-laryngoscopy Placement Confirmation: ETT inserted through vocal cords under direct vision, positive ETCO2 and breath sounds checked- equal and bilateral Secured at: 21 cm Tube secured with: Tape Dental Injury: Teeth and Oropharynx as per pre-operative assessment

## 2023-08-09 NOTE — Progress Notes (Signed)
Reason for Consult: Ectopic pregnancy, history of tubal ligation Referring Physician: Greig Right, PA-C  HPI:  Cynthia Cannon is an 32 y.o. G28P2002 female who presented with complaints of missed menses with prior history of tubal ligation.  Does note that she began bleeding last night, however was dark red blood with passage of clots. LMP was 06/24/2023 (approximate). Denies major abdominal pain, nausea/vomiting, shortness of breath, dizziness. Previously seen by Guadlupe Spanish, CNM for initial evaluation.   Pertinent Gynecological History: Menses: flow is moderate and regular every 24-26 days without intermenstrual spotting Contraception: tubal ligation Blood transfusions: none Last pap: abnormal: LGSIL, HR HPV+ (neg types 16/18)  Date: 07/02/2019 (reviewed in Care Everywhere)   Menstrual History: Menarche age: 37 Patient's last menstrual period was 06/24/2023.     OB History  Gravida Para Term Preterm AB Living  3 2 2  0 0 2  SAB IAB Ectopic Multiple Live Births  0 0 0 0 2    # Outcome Date GA Lbr Len/2nd Weight Sex Type Anes PTL Lv  3 Current           2 Term 2013 [redacted]w[redacted]d  2523 g F CS-LTranv   LIV  1 Term 2009 [redacted]w[redacted]d  3062 g M CS-LTranv   LIV     Past Medical History:  Diagnosis Date   Anemia    Obesity     Past Surgical History:  Procedure Laterality Date   CESAREAN SECTION     2    Family History  Problem Relation Age of Onset   Diabetes Mother    Lupus Father    Diabetes Sister     Social History:  reports that she has never smoked. She has never used smokeless tobacco. She reports current alcohol use. She reports that she does not use drugs.  Allergies: No Known Allergies  Medications: Prior to Admission:  No current facility-administered medications on file prior to encounter.   No current outpatient medications on file prior to encounter.     Review of Systems  Constitutional:  Negative for appetite change, chills and fever.  HENT:  Negative.    Eyes: Negative.   Respiratory: Negative.    Cardiovascular: Negative.   Gastrointestinal:  Positive for abdominal pain.  Genitourinary:  Positive for vaginal bleeding.  Musculoskeletal: Negative.   Neurological: Negative.   Psychiatric/Behavioral: Negative.      Blood pressure 116/66, pulse 68, temperature 98.8 F (37.1 C), temperature source Oral, resp. rate 18, height 5\' 2"  (1.575 m), weight 126.1 kg, last menstrual period 06/24/2023, SpO2 99%. Physical Exam Constitutional:      General: She is not in acute distress.    Appearance: Normal appearance. She is obese.  HENT:     Head: Normocephalic and atraumatic.  Cardiovascular:     Rate and Rhythm: Normal rate and regular rhythm.     Pulses: Normal pulses.     Heart sounds: Normal heart sounds.  Pulmonary:     Effort: Pulmonary effort is normal.     Breath sounds: Normal breath sounds.  Abdominal:     General: Bowel sounds are normal. There is no distension.     Palpations: Abdomen is soft.     Tenderness: There is abdominal tenderness. There is no guarding or rebound.  Genitourinary:    Comments: Deferred to OR Musculoskeletal:        General: No swelling or tenderness.     Cervical back: Normal range of motion and neck supple.  Skin:  General: Skin is warm and dry.  Neurological:     Mental Status: She is alert and oriented to person, place, and time.  Psychiatric:        Mood and Affect: Mood normal.        Behavior: Behavior normal.     Results for orders placed or performed during the hospital encounter of 08/09/23 (from the past 48 hour(s))  CBC     Status: Abnormal   Collection Time: 08/09/23 10:40 AM  Result Value Ref Range   WBC 5.6 4.0 - 10.5 K/uL   RBC 4.00 3.87 - 5.11 MIL/uL   Hemoglobin 12.2 12.0 - 15.0 g/dL   HCT 16.1 (L) 09.6 - 04.5 %   MCV 86.8 80.0 - 100.0 fL   MCH 30.5 26.0 - 34.0 pg   MCHC 35.2 30.0 - 36.0 g/dL   RDW 40.9 81.1 - 91.4 %   Platelets 274 150 - 400 K/uL   nRBC  0.0 0.0 - 0.2 %    Comment: Performed at [facility], [address] [place], [place] [address]  Basic metabolic panel     Status: Abnormal   Collection Time: 08/09/23 10:40 AM  Result Value Ref Range   Sodium 135 135 - 145 mmol/L   Potassium 3.3 (L) 3.5 - 5.1 mmol/L   Chloride 104 98 - 111 mmol/L   CO2 23 22 - 32 mmol/L   Glucose, Bld 102 (H) 70 - 99 mg/dL    Comment: Glucose reference range applies only to samples taken after fasting for at least 8 hours.   BUN 6 6 - 20 mg/dL   Creatinine, Ser 6.21 0.44 - 1.00 mg/dL   Calcium 8.6 (L) 8.9 - 10.3 mg/dL   GFR, Estimated >30 >86 mL/min    Comment: (NOTE) Calculated using the CKD-EPI Creatinine Equation (2021)    Anion gap 8 5 - 15    Comment: Performed at [facility], [address] [place], [place] [address]  hCG, quantitative, pregnancy     Status: Abnormal   Collection Time: 08/09/23 10:40 AM  Result Value Ref Range   hCG, Beta Chain, Quant, S 5,212 (H) <5 mIU/mL    Comment:          GEST. AGE      CONC.  (mIU/mL)   <=1 WEEK        5 - 50     2 WEEKS       50 - 500     3 WEEKS       100 - 10,000     4 WEEKS     1,000 - 30,000     5 WEEKS     3,500 - 115,000   6-8 WEEKS     12,000 - 270,000    12 WEEKS     15,000 - 220,000        FEMALE AND NON-PREGNANT FEMALE:     LESS THAN 5 mIU/mL Performed at [facility], [address] [place], [place] [address]   POC urine preg, ED     Status: Abnormal   Collection Time: 08/09/23 10:49 AM  Result Value Ref Range   Preg Test, Ur POSITIVE (A) NEGATIVE    Comment:        THE SENSITIVITY OF THIS METHODOLOGY IS >24 mIU/mL   ABO/Rh     Status: None   Collection Time: 08/09/23 11:52 AM  Result Value Ref Range   ABO/RH(D)  O POS Performed at [facility], [address] [place], [place] [address]   Type and screen [facility]     Status: None   Collection Time: 08/09/23  3:53 PM  Result Value Ref Range    ABO/RH(D) O POS    Antibody Screen NEG    Sample Expiration      08/12/2023,2359 Performed at [facility], [address] [place], [place] [address]     US OB LESS THAN 14 WEEKS WITH [place] TRANSVAGINAL  Result Date: 08/09/2023 CLINICAL DATA:  Heavy vaginal bleeding and pelvic pain beginning last night. First trimester pregnancy. EXAM: OBSTETRIC <14 WK [place] AND TRANSVAGINAL OB US TECHNIQUE: Both transabdominal and transvaginal ultrasound examinations were performed for complete evaluation of the gestation as well as the maternal uterus, adnexal regions, and pelvic cul-de-sac. Transvaginal technique was performed to assess early pregnancy. COMPARISON:  None Available. FINDINGS: Intrauterine gestational sac: None Maternal uterus/adnexae: Endometrial thickness measures 13 mm. The right ovary is normal in appearance. No normal left ovary is visualized. A heterogeneous mass is seen in the left adnexa which measures 5.0 x 4.1 x 5.8 cm, highly suspicious for ectopic pregnancy. Trace amount of free fluid noted in pelvic cul-de-sac. IMPRESSION: No IUP visualized. 5.8 cm heterogeneous left adnexal mass, highly suspicious for ectopic pregnancy. Critical Value/emergent results were called by telephone at the time of interpretation on 08/09/2023 at 2:09 pm to provider Greig Right , who verbally acknowledged these results. Electronically Signed   By: Danae Orleans M.D.   On: 08/09/2023 14:17    Assessment/Plan: Ectopic pregnancy - 32 y.o. [id number] with left ectopic pregnancy with remote history or BTL.   On exam, she had stable vital signs, and is otherwise hemodynamically stable.  Patient was counseled regarding need for surgical intervention with ectopic removal. Risks of surgery including bleeding which may require transfusion or reoperation, infection, injury to bowel or other surrounding organs, need for additional procedures (including laparotomy) were explained to patient.  Discussed option of removal of  bilateral fallopian tubes as she has previously had a tubal ligation for prevention of future ectopic pregnancies in contralateral tube, patient is happy with this plan and is in agreement.  Written informed consent was obtained.  Patient has been NPO since 1 p.m. and she will remain NPO for procedure. Anesthesia and OR aware.  Is beginning to note more pain on her left side, so will change to Urgent Procedure. Preoperative  SCDs ordered on call to the OR.  To OR when ready.  Rh positive, no indication for Rhogam.  History of abnormal cycles -  Patient inquires about methods to help with her menstrual cycles. Notes that she sometimes has 2 periods a month and would like to not have cycles. Cycles can sometimes be heavy. Initially requested if hysterectomy could be performed at time of this surgery however informed that this surgery was considered more urgent, and that a hysterectomy was a major undertaking at this time and better as a scheduled procedure. Also discussed other less invasive ways to manage her bleeding, including use of contraception. Notes she has used Depo Provera in the past but has concerns about further weight gain. Discussed hormonal IUD placement vs Nexplanon vs continuous progesterone orally (I.e. Aygestin). Patient notes she would definitely consider an IUD.  Can discuss further at post-op visit in office.   Hildred Laser, MD [facility] at [facility] 08/09/2023, 5:37 PM

## 2023-08-09 NOTE — Anesthesia Preprocedure Evaluation (Signed)
Anesthesia Evaluation  Patient identified by MRN, date of birth, ID band Patient awake    Reviewed: Allergy & Precautions, H&P , NPO status , Patient's Chart, lab work & pertinent test results, reviewed documented beta blocker date and time   Airway Mallampati: II  TM Distance: >3 FB Neck ROM: full    Dental  (+) Teeth Intact   Pulmonary neg pulmonary ROS   Pulmonary exam normal        Cardiovascular negative cardio ROS Normal cardiovascular exam Rhythm:regular Rate:Normal     Neuro/Psych negative neurological ROS  negative psych ROS   GI/Hepatic negative GI ROS, Neg liver ROS,,,  Endo/Other    Morbid obesity  Renal/GU negative Renal ROS  negative genitourinary   Musculoskeletal   Abdominal   Peds  Hematology  (+) Blood dyscrasia, anemia   Anesthesia Other Findings Past Medical History: No date: Anemia No date: Obesity Past Surgical History: No date: CESAREAN SECTION     Comment:  x 2 2013: TUBAL LIGATION     Comment:  with last Cesarean delivery BMI    Body Mass Index: 50.85 kg/m     Reproductive/Obstetrics negative OB ROS                             Anesthesia Physical Anesthesia Plan  ASA: 3 and emergent  Anesthesia Plan: General ETT   Post-op Pain Management:    Induction:   PONV Risk Score and Plan:   Airway Management Planned:   Additional Equipment:   Intra-op Plan:   Post-operative Plan:   Informed Consent: I have reviewed the patients History and Physical, chart, labs and discussed the procedure including the risks, benefits and alternatives for the proposed anesthesia with the patient or authorized representative who has indicated his/her understanding and acceptance.     Dental Advisory Given  Plan Discussed with: CRNA  Anesthesia Plan Comments:        Anesthesia Quick Evaluation

## 2023-08-09 NOTE — ED Notes (Signed)
Pt in a gown. Consent form completed by physician. Belongings to be sent with family. Pt to be transported by this RN to pre-op four.

## 2023-08-09 NOTE — ED Notes (Signed)
Pt to US at this time.

## 2023-08-09 NOTE — ED Provider Notes (Signed)
[facility] Provider Note    Event Date/Time   First MD Initiated Contact with Patient 08/09/23 1110     (approximate)   History   Vaginal Bleeding   HPI  Cynthia Cannon is a 32 y.o. female with history of tubal ligation presents emergency department with vaginal bleeding that started last night.  States dark in color has a lot of clots.  Some pelvic pain.  No fever or chills.      Physical Exam   Triage Vital Signs: ED Triage Vitals  Encounter Vitals Group     BP 08/09/23 1039 (!) 162/87     Systolic BP Percentile --      Diastolic BP Percentile --      Pulse Rate 08/09/23 1039 (!) 118     Resp 08/09/23 1039 20     Temp 08/09/23 1039 98.8 F (37.1 C)     Temp Source 08/09/23 1039 Oral     SpO2 08/09/23 1039 99 %     Weight 08/09/23 1049 278 lb (126.1 kg)     Height 08/09/23 1049 5\' 2"  (1.575 m)     Head Circumference --      Peak Flow --      Pain Score 08/09/23 1049 2     Pain Loc --      Pain Education --      Exclude from Growth Chart --     Most recent vital signs: Vitals:   08/09/23 1039 08/09/23 1554  BP: (!) 162/87 116/66  Pulse: (!) 118 68  Resp: 20 18  Temp: 98.8 F (37.1 C)   SpO2: 99% 99%     General: Awake, no distress.   CV:  Good peripheral perfusion. regular rate and  rhythm Resp:  Normal effort.  Abd:  No distention.   Other:     ED Results / Procedures / Treatments   Labs (all labs ordered are listed, but only abnormal results are displayed) Labs Reviewed  CBC - Abnormal; Notable for the following components:      Result Value   HCT 34.7 (*)    All other components within normal limits  BASIC METABOLIC PANEL - Abnormal; Notable for the following components:   Potassium 3.3 (*)    Glucose, Bld 102 (*)    Calcium 8.6 (*)    All other components within normal limits  HCG, QUANTITATIVE, PREGNANCY - Abnormal; Notable for the following components:   hCG, Beta Chain, Quant, S 5,212 (*)    All  other components within normal limits  POC URINE PREG, ED - Abnormal; Notable for the following components:   Preg Test, Ur POSITIVE (*)    All other components within normal limits  ABO/RH  TYPE AND SCREEN     EKG     RADIOLOGY Ultrasound OB less than 14 weeks    PROCEDURES:   Procedures   MEDICATIONS ORDERED IN ED: Medications - No data to display   IMPRESSION / MDM / ASSESSMENT AND PLAN / ED COURSE  I reviewed the triage vital signs and the nursing notes.                              Differential diagnosis includes, but is not limited to, vaginal bleeding, ectopic pregnancy, subchorionic hemorrhage, miscarriage  Patient's presentation is most consistent with acute presentation with potential threat to life or bodily function.   POC pregnancy is positive.  This gives me concerns for an ectopic due to her trip to the ligation in 2013  CBC metabolic panel reassuring, beta-hCG is 5212 which gives additional concern for ectopic  ABO/Rh is pending  Ultrasound OB less than 14 weeks   Consult with radiology.  Dr. Eppie Gibson states patient has a ectopic in the left side.  Minimal fluid in the pelvis.  Consult to GYN.  Spoke with Guadlupe Spanish, CNM she is contacting Dr. Valentino Saxon.  Callback from [place],, she is coming to evaluate and Dr. Valentino Saxon will be taking to the OR     FINAL CLINICAL IMPRESSION(S) / ED DIAGNOSES   Final diagnoses:  Ectopic pregnancy without intrauterine pregnancy, unspecified location     Rx / DC Orders   ED Discharge Orders     None        Note:  This document was prepared using Dragon voice recognition software and may include unintentional dictation errors.    Faythe Ghee, PA-C 08/09/23 1606    Jene Every, MD 08/09/23 Evette Doffing    Jene Every, MD 08/09/23 [phone]

## 2023-08-09 NOTE — ED Triage Notes (Signed)
Pt here with vaginal bleeding that started Thurs. Pt states her tubes are tied as of 2013. Pt stated having severe bleeding last night. Pt states the blood was dark in color and had clots in it.

## 2023-08-09 NOTE — Discharge Instructions (Addendum)
AMBULATORY SURGERY  DISCHARGE INSTRUCTIONS   The drugs that you were given will stay in your system until tomorrow so for the next 24 hours you should not:  Drive an automobile Make any legal decisions Drink any alcoholic beverage   You may resume regular meals tomorrow.  Today it is better to start with liquids and gradually work up to solid foods.  You may eat anything you prefer, but it is better to start with liquids, then soup and crackers, and gradually work up to solid foods.   Please notify your doctor immediately if you have any unusual bleeding, trouble breathing, redness and pain at the surgery site, drainage, fever, or pain not relieved by medication.    Additional Instructions:        Laparoscopic Tubal Removal for Ectopic Pregnancy Discharge Instructions Laparoscopic tubal removal is a procedure that removes the fallopian tube containing the ectopic pregnancy. RISKS AND COMPLICATIONS  Infection. Bleeding. Injury to surrounding organs. Anesthetic side effects. Failure of the procedure. Risks of future ectopic pregnancy on the other side PROCEDURE  You may be given a medicine to help you relax (sedative) before the procedure. You will be given a medicine to make you sleep (general anesthetic) during the procedure. A tube will be put down your throat to help your breath while under general anesthesia. Two small cuts (incisions) are made in the lower abdominal area and one incision is made near the belly button. Your abdominal area will be inflated with a safe gas (carbon dioxide). This helps give the surgeon room to operate, visualize, and helps the surgeon avoid other organs. A thin, lighted tube (laparoscope) with a camera attached is inserted into your abdomen through the incision near the belly button. Other small instruments are also inserted through the other abdominal incisions. The fallopian tube is located and are removed. After the fallopian tube is  removed, the gas is released from the abdomen. The incisions will be closed with stitches (sutures), and Dermabond. A bandage may be placed over the incisions. AFTER THE PROCEDURE  You will also have some mild abdominal discomfort for 3-7 days. You will be given pain medicine to ease any discomfort. As long as there are no problems, you may be allowed to go home. Someone will need to drive you home and be with you for at least 24 hours once home. You may have some mild discomfort in the throat. This is from the tube placed in your throat while you were sleeping. You may experience discomfort in the shoulder area from some trapped air between the liver and diaphragm. This sensation is normal and will slowly go away on its own. HOME CARE INSTRUCTIONS  Take all medicines as directed. Only take over-the-counter or prescription medicines for pain, discomfort, or fever as directed by your caregiver. Resume daily activities as directed. Showers are preferred over baths. You may resume sexual activities in 1 week or as directed. Do not drive while taking narcotics. SEEK MEDICAL CARE IF: . There is increasing abdominal pain. You feel lightheaded or faint. You have the chills. You have an oral temperature above 102 F (38.9 C). There is pus-like (purulent) drainage from any of the wounds. You are unable to pass gas or have a bowel movement. You feel sick to your stomach (nauseous) or throw up (vomit). MAKE SURE YOU:  Understand these instructions. Will watch your condition. Will get help right away if you are not doing well or get worse.  [employer] Patient Information 2013  [employer]

## 2023-08-10 ENCOUNTER — Encounter: Payer: Self-pay | Admitting: Obstetrics and Gynecology

## 2023-08-10 NOTE — Progress Notes (Signed)
Darnelle Maffucci, RN came over to PACU to verify methotrexate injection for the patient. Right amount from pharmacy in syringe. Given to patient in her thigh area.

## 2023-08-10 NOTE — Op Note (Signed)
Procedure(s): LAPAROSCOPIC BILATERAL SALPINGECTOMY WITH REMOVAL OF LEFT ECTOPIC, LYSIS OF ADHESIONS, CYSTOSCOPY Procedure Note  Cynthia Cannon female 32 y.o. 08/10/2023  Indications: The patient is a morbidly obese 32 y.o. [id number] female with left ectopic pregnancy, history of tubal ligation. Also with prior history of C-section x 2.   Pre-operative Diagnosis: Left ectopic pregnancy, morbid obesity,   Post-operative Diagnosis: Same, with dense pelvic adhesions  Surgeon: Hildred Laser, MD  Assistants:  Surgical scrub tech.   Anesthesia: General endotracheal anesthesia  Findings: - The uterus was sounded to 7.5 cm.  - Omental adhesions to the anterior abdominal wall.  - Significant pelvic adhesions of the uterus to the anterior abdominal wall.  - Dense adhesions of the left adnexae to the left pelvic sidewall. Difficult to identify individual tube and ovary due to adhesions and moderate blood due to likely rupture of ectopic.  - Small amount of adhesions of distal segment of right fallopian tube to posterior surface of the uterus.  - Previously interrupted fallopian tubes bilaterally, however right tube did not appear to be completely interrupted.  - Small hemoperitoneum observed  Procedure Details: The patient was seen in the Holding Room. The risks, benefits, complications, treatment options, and expected outcomes were discussed with the patient.  The patient concurred with the proposed plan, giving informed consent.  The site of surgery properly noted/marked. The patient was taken to the Operating Room, identified as [name] and the procedure verified as Procedure(s) (LRB): LAPAROSCOPIC BILATERAL SALPINGECTOMY.   She was then placed under general anesthesia without difficulty. She was placed in the dorsal lithotomy position, and was prepped and draped in a sterile manner.  A straight catheterization was performed. A sterile speculum was inserted into the vagina and  the cervix was grasped at the anterior lip using a single-toothed tenaculum.  The uterus was sounded to 7.5 cm, and a Hulka clamp was placed for uterine manipulation.  The speculum and tenaculum were then removed. After an adequate timeout was performed, attention was turned to the abdomen where an umbilical incision was made with the scalpel.  The Optiview 5-mm trocar and sleeve were then advanced without difficulty with the laparoscope under direct visualization into the abdomen.  The abdomen was then insufflated with carbon dioxide gas and adequate pneumoperitoneum was obtained. Abdominal omental adhesions were noted near the umbilical port site and towards the left pelvic sidewall. A 5-mm right lower quadrant port and an 11-mm left lower quadrant port were then placed under direct visualization.  A survey of the patient's pelvis and abdomen revealed the findings as above.  A suction-irrigator was used to suction the small hemoperitoneum noted in the pelvis, along with significant brown clots surrounding the left adnexa. Adhesiolysis of a segment of the omental adhesions was performed on the left in order to aid with visualization. On the right, the distal end of the fallopian tube was dissected from the filmy adhesions present, elevated, clamped and transected using the Harmonic device. This was carried along the length of the fallopian tube, as the tube did not appear to be completely interrupted.  This was removed through the 11-mm port site.  Attempts were made to dissect the uterus from adhesions from the anterior abdominal wall in order to allow better mobilization of the uterus to visualize the left adnexae which were also adherent.  A small amount of the adhesions were dissected as there was limited visibility of the bladder.  Lengthy attempts of adhesiolysis was made to dissect the  left adnexae from the sidewall. Able to identify some ovarian tissue, but unable to identify tubal tissue due to adhesions  and suspected rupture of the fallopian tube.  Light bleeding from the left adnexae as well. The decision was made that due to difficulty in identifying a clear plane of ovary, tube, and pelvic sidewall tissue due to adhesions and bleeding, to remove the entire adnexa (including suspected ovary).  The proximal end of the right fallopian tube could not be identified due to adhesions.The presumed pedical was clamped and cauterized with the Harmonic device.  An Endocatch bag was then inserted into the 11 mm trochar and the left adnexae and at least a portion of the ectopic pregnancy were removed. Examination of the left pelvic sidewall continued to note steady oozing.  Attempts to cauterize the area were made using the Harmonic device and the Kleppingers, however due to the friable nature of the tissue this was ineffective at controlling the oozing. Additionally, due to the location of the bleeding, and inability to identify major structures such as the uterus and other larger blood vessels due to the adhesions and bleeding, the decision was made not to proceed with any further coagulation and a cystoscopy would be indicated post-procedure.   Arista was then placed over the oozing area to attempt to achieve hemostasis. Hemostasis was initially not achieved with this method, and so fibrillar was then placed over the area.  The area was monitored for several minutes, with no further significant oozing present.   Next, a final survey of the abdomen was performed.  Hemostasis was finally achieved. All trocars were removed under direct visualization, and the abdomen which was desufflated. The fascia of the 11-mm incision was then grasped with Kelly clamps and approximated using 0-Vicryl in a running fashion.   All skin incisions were closed with 4-0 Monocryl subcuticular stitches.  A total of 18 ml of 0.5% Sensorcaine was injected into the incision for analgesia. Dermabond was placed over the incisions.  Next attention  was turned below where a cystoscopy was performed using a 30 degree cystoscope.  Approximately 0.5 cc of methylene blue dye had been injected intravenously during incision closure.  The bladder was filled with approximately 200 ml of normal saline. The bladder was surveyed. The mucosa was intact with no signs of injury.  Bilateral efflux of the fluorescein dye was noted from the ureteric orifices.  The bladder was allowed to drain and the cystoscope was removed.   The patient tolerated the procedures well.  All instruments, needles, and sponge counts were correct x 2. The patient was taken to the recovery room awake, extubated and in stable condition. She will receive a dose of IM Methotrexate in the PACU area as there was concern for possible retained products due to inability to fully identify the ectopic and fallopian tube.   This case took approximately 3 hrs and 15 minutes, and >50 of total case time was spent performing lysis of adhesions.   Estimated Blood Loss:  200 ml (including small hemoperitoneum)      Drains: straight catheterization prior to procedure with  10 ml of clear urine         Total IV Fluids:   1300 ml  Specimens: Segments of bilateral fallopian tubes bilaterally, left ovary and portion of ectopic pregnancy         Implants: None         Complications:  None; patient tolerated the procedure well.  Disposition: PACU - hemodynamically stable.         Condition: stable   Hildred Laser, MD [facility]

## 2023-08-11 NOTE — Progress Notes (Deleted)
OBSTETRICS/GYNECOLOGY POST-OPERATIVE CLINIC VISIT  Subjective:     Cynthia Cannon is a 32 y.o. female who presents to the clinic 1 weeks status post LAPAROSCOPIC BILATERAL SALPINGECTOMY WITH REMOVAL OF LEFT ECTOPIC, LYSIS OF ADHESIONS, CYSTOSCOPY  for Left ectopic pregnancy, History of right ectopic pregnancy and bilateral tubal ligation . Eating a regular diet {with-without:5700} difficulty. Bowel movements are {normal/abnormal***:19619}. {pain control:13522::"The patient is not having any pain."}  {Common ambulatory SmartLinks:19316}  Review of Systems {ros; complete:30496}   Objective:   LMP 06/24/2023  There is no height or weight on file to calculate BMI.  General:  alert and no distress  Abdomen: soft, bowel sounds active, non-tender  Incision:   {incision:13716::"no dehiscence","incision well approximated","healing well","no drainage","no erythema","no hernia","no seroma","no swelling"}    Pathology:    1. Fallopian tube, right,  :      - SEGMENT OF BENIGN FIMBRIATED FALLOPIAN TUBE WITH PARATUBAL CYST       2. Fallopian tube, left, with ectopic :      - FRAGMENTS OF BLOOD CLOT WITH DECIDUALIZED TISSUE AND FOCAL CHORIONIC VILLI      CONSISTENT WITH ECTOPIC PREGNANCY      - DISRUPTED BENIGN OVARIAN PARENCHYMA WITH A HEMORRHAGIC CORPUS LUTEAL CYST      - SEE NOTE       Diagnosis Note : The patient's clinical history and operative findings are      noted.The sections examined primarily consist of ovarian parenchyma with a      hemorrhagic corpus luteal cyst.There is a scant, rare focus of possible      disrupted, tubal epithelium that is identified in association with the fragment      of blood clot and chorionic villi on microscopic examination.Additional sections      for the identification of possible fallopian tube are pending.The findings will      be reported in an addendum to follow.Clinical correlation recommended.  CORRECTED [id number]: Examination  of additional sections Addendum: (Part 2) The additional sections show focal, definitive fallopian tube epithelium, chorionic villi, decidualized tissue and benign ovarian parenchyma.  Clinical correlation recommended.  ELECTRONIC SIGNATURE : Corey Harold M.D., Dossie Arbour., Pathologist, Electronic Signature  MICROSCOPIC DESCRIPTION  CASE COMMENTS STAINS USED IN DIAGNOSIS: H&E H&E H&E H&E H&E H&E H&E H&E  CLINICAL HISTORY  SPECIMEN(S) OBTAINED 1. Fallopian tube, right, 2. Fallopian tube, left, With Ectopic  SPECIMEN COMMENTS: SPECIMEN CLINICAL INFORMATION: 1. Ectopic pregnancy  Gross Description 1. Received in formalin, labeled "right fallopian tube", is a 5.0 cm, 0.6-1.2 cm diameter fimbriated fallopian tube.There is a 1.6 x 1.4 x 1.0 cm simple cyst on the serosa containing serous fluid and featuring a smooth lining.The fallopian tube has a focally thickened, fibrotic wall up to 0.6 cm thick.      Block Summary:      1A-1B: Bisected fimbira, submitted entirely      1C: Representative fallopiabn tube cross-sections 2. Received in formalin, labeled "left fallopian tube with ectopic", is a 3.4 x 2.5 x 1.8 cm fragment of ragged, focally disrupted soft tissue.The external surface is red-gray, hemorrhagic, and partially covered by tan-pink serosa.Sectioning reveals a focally hemorrhagic cut surface with a 0.6 cm perforation through the fallopian tube wall.There is prominent yellow, myxoid cahnge immeidately surroudning the hemorrhage.No discrete placenta or fetus is identified.Representative sections are submitted in blocks 2A-2C.   Assessment:   Patient s/p *** (surgery)  {doing well:13525::"Doing well postoperatively."}   Plan:   1. Continue any current medications as instructed by provider.  2. Wound care discussed. 3. Operative findings again reviewed. Pathology report discussed. 4. Activity restrictions: {restrictions:13723} 5. Anticipated return to work: {work  return:14002}. 6. Follow up: {1-61:09604} {time; units:18646} for ***    Hildred Laser, MD [facility]]

## 2023-08-12 LAB — SURGICAL PATHOLOGY

## 2023-08-15 NOTE — Anesthesia Postprocedure Evaluation (Signed)
Anesthesia Post Note  Patient: Cynthia Cannon  Procedure(s) Performed: LAPAROSCOPIC BILATERAL SALPINGECTOMY WITH REMOVAL OF LEFT ECTOPIC, LYSIS OF ADHESIONS, CYSTOSCOPY (Bilateral: Abdomen)  Patient location during evaluation: PACU Anesthesia Type: General Level of consciousness: awake and alert Pain management: pain level controlled Vital Signs Assessment: post-procedure vital signs reviewed and stable Respiratory status: spontaneous breathing, nonlabored ventilation, respiratory function stable and patient connected to nasal cannula oxygen Cardiovascular status: blood pressure returned to baseline and stable Postop Assessment: no apparent nausea or vomiting Anesthetic complications: no   No notable events documented.   Last Vitals:  Vitals:   08/09/23 2345 08/10/23 0000  BP: 110/60 125/80  Pulse: (!) 106 (!) 101  Resp: 14 16  Temp:    SpO2: 99% 100%    Last Pain:  Vitals:   08/10/23 0000  TempSrc:   PainSc: 0-No pain                 Yevette Edwards

## 2023-08-16 ENCOUNTER — Telehealth: Payer: Self-pay | Admitting: Obstetrics and Gynecology

## 2023-08-16 ENCOUNTER — Encounter: Payer: BLUE CROSS/BLUE SHIELD | Admitting: Obstetrics and Gynecology

## 2023-08-16 NOTE — Telephone Encounter (Signed)
Reached out to pt to reschedule post op appt that was scheduled on 08/16/2023 at 10:55 with Dr. Valentino Saxon.  Left message for pt to call back.

## 2023-08-17 ENCOUNTER — Encounter: Payer: Self-pay | Admitting: Obstetrics and Gynecology

## 2023-08-17 NOTE — Telephone Encounter (Signed)
Reached out to pt (2x) to reschedule post op appt that was scheduled on 08/16/2023 at 10:55 with Dr. Valentino Saxon.  Left message for pt to call back.  Will send a MyChart letter.
